# Patient Record
Sex: Male | Born: 1955
Health system: Southern US, Community
[De-identification: ages and names within clinical notes are randomized; demographics above are authoritative.]

## PROBLEM LIST (undated history)

## (undated) DIAGNOSIS — I219 Acute myocardial infarction, unspecified: Secondary | ICD-10-CM

## (undated) DIAGNOSIS — I2510 Atherosclerotic heart disease of native coronary artery without angina pectoris: Secondary | ICD-10-CM

## (undated) DIAGNOSIS — E7800 Pure hypercholesterolemia, unspecified: Secondary | ICD-10-CM

## (undated) DIAGNOSIS — I255 Ischemic cardiomyopathy: Secondary | ICD-10-CM

## (undated) HISTORY — DX: Ischemic cardiomyopathy: I25.5

---

## 2004-03-30 ENCOUNTER — Ambulatory Visit: Payer: Self-pay | Admitting: Internal Medicine

## 2004-04-06 ENCOUNTER — Ambulatory Visit: Payer: Self-pay | Admitting: Internal Medicine

## 2012-05-29 ENCOUNTER — Emergency Department (HOSPITAL_COMMUNITY)
Admission: EM | Admit: 2012-05-29 | Discharge: 2012-05-29 | Disposition: A | Discharge status: Home / Self Care | Payer: Self-pay | Attending: Emergency Medicine | Admitting: Emergency Medicine

## 2012-05-29 ENCOUNTER — Encounter (HOSPITAL_COMMUNITY): Payer: Self-pay | Admitting: *Deleted

## 2012-05-29 DIAGNOSIS — I252 Old myocardial infarction: Secondary | ICD-10-CM | POA: Insufficient documentation

## 2012-05-29 DIAGNOSIS — Z79899 Other long term (current) drug therapy: Secondary | ICD-10-CM | POA: Insufficient documentation

## 2012-05-29 DIAGNOSIS — R369 Urethral discharge, unspecified: Secondary | ICD-10-CM | POA: Insufficient documentation

## 2012-05-29 DIAGNOSIS — E7800 Pure hypercholesterolemia, unspecified: Secondary | ICD-10-CM | POA: Insufficient documentation

## 2012-05-29 DIAGNOSIS — Z7982 Long term (current) use of aspirin: Secondary | ICD-10-CM | POA: Insufficient documentation

## 2012-05-29 DIAGNOSIS — F17200 Nicotine dependence, unspecified, uncomplicated: Secondary | ICD-10-CM | POA: Insufficient documentation

## 2012-05-29 DIAGNOSIS — I2510 Atherosclerotic heart disease of native coronary artery without angina pectoris: Secondary | ICD-10-CM | POA: Insufficient documentation

## 2012-05-29 DIAGNOSIS — Z7902 Long term (current) use of antithrombotics/antiplatelets: Secondary | ICD-10-CM | POA: Insufficient documentation

## 2012-05-29 HISTORY — DX: Pure hypercholesterolemia, unspecified: E78.00

## 2012-05-29 HISTORY — DX: Acute myocardial infarction, unspecified: I21.9

## 2012-05-29 HISTORY — DX: Atherosclerotic heart disease of native coronary artery without angina pectoris: I25.10

## 2012-05-29 MED ORDER — AZITHROMYCIN 250 MG PO TABS
1000.0000 mg | ORAL_TABLET | Freq: Once | ORAL | Status: AC
  Administered 2012-05-29: 1000 mg via ORAL
  Filled 2012-05-29: qty 4

## 2012-05-29 MED ORDER — CEFTRIAXONE SODIUM 250 MG IJ SOLR
250.0000 mg | Freq: Once | INTRAMUSCULAR | Status: AC
  Administered 2012-05-29: 250 mg via INTRAMUSCULAR
  Filled 2012-05-29: qty 250

## 2012-05-29 NOTE — ED Notes (Signed)
Pt reports penile discharge since Tuesday, denies diff or pain urinating. No acute distress noted at triage.

## 2012-05-29 NOTE — ED Provider Notes (Signed)
Medical screening examination/treatment/procedure(s) were performed by non-physician practitioner and as supervising physician I was immediately available for consultation/collaboration.   Charles B. Bernette Mayers, MD 05/29/12 1440

## 2012-05-29 NOTE — ED Provider Notes (Signed)
History     CSN: [id number]  Arrival date & time 05/29/12  1317   First MD Initiated Contact with Patient 05/29/12 1411      Chief Complaint  Patient presents with  . Penile Discharge    (Consider location/radiation/quality/duration/timing/severity/associated sxs/prior treatment) HPI Patient to the ED for complaints of penile discharge since Tuesday. He describes it as milky white. He is sexually active without protection but says he has only been sleeping with the same girl. Denies any fevers, rashes, penile pain, dysuria, abdominal pain, N/V/D. nad vss Past Medical History  Diagnosis Date  . MI (myocardial infarction)   . Coronary artery disease   . High cholesterol     History reviewed. No pertinent past surgical history.  History reviewed. No pertinent family history.  History  Substance Use Topics  . Smoking status: Current Every Day Smoker    Types: Cigars  . Smokeless tobacco: Not on file  . Alcohol Use: Yes     Comment: occ      Review of Systems  Genitourinary: Positive for discharge.    Allergies  Review of patient's allergies indicates no known allergies.  Home Medications   Current Outpatient Rx  Name  Route  Sig  Dispense  Refill  . aspirin 81 MG tablet   Oral   Take 81 mg by mouth daily.         Marland Kitchen atenolol (TENORMIN) 50 MG tablet   Oral   Take 50 mg by mouth daily.         . clopidogrel (PLAVIX) 75 MG tablet   Oral   Take 75 mg by mouth daily.         . simvastatin (ZOCOR) 20 MG tablet   Oral   Take 20 mg by mouth every evening.           BP 119/73  Pulse 72  Temp(Src) 98.4 F (36.9 C) (Oral)  Resp 16  SpO2 99%  Physical Exam  Nursing note and vitals reviewed. Constitutional: He appears well-developed and well-nourished. No distress.  HENT:  Head: Normocephalic and atraumatic.  Eyes: Pupils are equal, round, and reactive to light.  Neck: Normal range of motion. Neck supple.  Cardiovascular: Normal rate and regular  rhythm.   Pulmonary/Chest: Effort normal.  Abdominal: Soft.  Genitourinary: Penis normal. No discharge found.  Neurological: He is alert.  Skin: Skin is warm and dry.    ED Course  Procedures (including critical care time)  Labs Reviewed  GC/CHLAMYDIA PROBE AMP   No results found.   1. Penile discharge       MDM  Given given rocephin and Azithro in ED.  Will be called if cultures are positive.  Pt has been advised of the symptoms that warrant their return to the ED. Patient has voiced understanding and has agreed to follow-up with the PCP or specialist.         Dorthula Matas, PA-C 05/29/12 1430

## 2014-05-02 ENCOUNTER — Emergency Department (HOSPITAL_COMMUNITY): Payer: Medicaid Other

## 2014-05-02 ENCOUNTER — Inpatient Hospital Stay (HOSPITAL_COMMUNITY)
Admission: EM | Admit: 2014-05-02 | Discharge: 2014-05-04 | DRG: 247 | Disposition: A | Discharge status: Home / Self Care | Payer: Medicaid Other | Attending: Cardiovascular Disease | Admitting: Cardiovascular Disease

## 2014-05-02 ENCOUNTER — Encounter (HOSPITAL_COMMUNITY): Payer: Self-pay | Admitting: Family Medicine

## 2014-05-02 DIAGNOSIS — R079 Chest pain, unspecified: Secondary | ICD-10-CM | POA: Diagnosis present

## 2014-05-02 DIAGNOSIS — Z7902 Long term (current) use of antithrombotics/antiplatelets: Secondary | ICD-10-CM

## 2014-05-02 DIAGNOSIS — Z79899 Other long term (current) drug therapy: Secondary | ICD-10-CM

## 2014-05-02 DIAGNOSIS — Z955 Presence of coronary angioplasty implant and graft: Secondary | ICD-10-CM

## 2014-05-02 DIAGNOSIS — I255 Ischemic cardiomyopathy: Secondary | ICD-10-CM | POA: Diagnosis present

## 2014-05-02 DIAGNOSIS — Z7982 Long term (current) use of aspirin: Secondary | ICD-10-CM | POA: Diagnosis not present

## 2014-05-02 DIAGNOSIS — I252 Old myocardial infarction: Secondary | ICD-10-CM | POA: Diagnosis not present

## 2014-05-02 DIAGNOSIS — Z720 Tobacco use: Secondary | ICD-10-CM | POA: Diagnosis present

## 2014-05-02 DIAGNOSIS — R001 Bradycardia, unspecified: Secondary | ICD-10-CM | POA: Diagnosis not present

## 2014-05-02 DIAGNOSIS — I10 Essential (primary) hypertension: Secondary | ICD-10-CM | POA: Diagnosis present

## 2014-05-02 DIAGNOSIS — E785 Hyperlipidemia, unspecified: Secondary | ICD-10-CM

## 2014-05-02 DIAGNOSIS — I214 Non-ST elevation (NSTEMI) myocardial infarction: Secondary | ICD-10-CM | POA: Diagnosis present

## 2014-05-02 DIAGNOSIS — I25110 Atherosclerotic heart disease of native coronary artery with unstable angina pectoris: Secondary | ICD-10-CM | POA: Diagnosis present

## 2014-05-02 DIAGNOSIS — Z9861 Coronary angioplasty status: Secondary | ICD-10-CM

## 2014-05-02 DIAGNOSIS — E780 Pure hypercholesterolemia: Secondary | ICD-10-CM | POA: Diagnosis present

## 2014-05-02 DIAGNOSIS — F17290 Nicotine dependence, other tobacco product, uncomplicated: Secondary | ICD-10-CM | POA: Diagnosis present

## 2014-05-02 DIAGNOSIS — I200 Unstable angina: Secondary | ICD-10-CM | POA: Diagnosis present

## 2014-05-02 DIAGNOSIS — I2510 Atherosclerotic heart disease of native coronary artery without angina pectoris: Secondary | ICD-10-CM | POA: Diagnosis present

## 2014-05-02 LAB — BASIC METABOLIC PANEL
ANION GAP: 10 (ref 5–15)
BUN: 9 mg/dL (ref 6–23)
CO2: 24 mmol/L (ref 19–32)
Calcium: 9.2 mg/dL (ref 8.4–10.5)
Chloride: 103 mmol/L (ref 96–112)
Creatinine, Ser: 0.95 mg/dL (ref 0.50–1.35)
GFR calc non Af Amer: 89 mL/min — ABNORMAL LOW (ref >90)
GLUCOSE: 77 mg/dL (ref 70–99)
POTASSIUM: 3.8 mmol/L (ref 3.5–5.1)
Sodium: 137 mmol/L (ref 135–145)

## 2014-05-02 LAB — BRAIN NATRIURETIC PEPTIDE: B NATRIURETIC PEPTIDE 5: 54.4 pg/mL (ref 0.0–100.0)

## 2014-05-02 LAB — CBC
HEMATOCRIT: 46.5 % (ref 39.0–52.0)
Hemoglobin: 15.9 g/dL (ref 13.0–17.0)
MCH: 29.2 pg (ref 26.0–34.0)
MCHC: 34.2 g/dL (ref 30.0–36.0)
MCV: 85.5 fL (ref 78.0–100.0)
Platelets: 269 10*3/uL (ref 150–400)
RBC: 5.44 MIL/uL (ref 4.22–5.81)
RDW: 14.8 % (ref 11.5–15.5)
WBC: 6.5 10*3/uL (ref 4.0–10.5)

## 2014-05-02 LAB — RAPID URINE DRUG SCREEN, HOSP PERFORMED
AMPHETAMINES: NOT DETECTED
BARBITURATES: NOT DETECTED
BENZODIAZEPINES: NOT DETECTED
COCAINE: NOT DETECTED
OPIATES: NOT DETECTED
Tetrahydrocannabinol: POSITIVE — AB

## 2014-05-02 LAB — MRSA PCR SCREENING: MRSA by PCR: NEGATIVE

## 2014-05-02 LAB — HEPARIN LEVEL (UNFRACTIONATED)
HEPARIN UNFRACTIONATED: 0.24 [IU]/mL — AB (ref 0.30–0.70)
Heparin Unfractionated: 0.33 IU/mL (ref 0.30–0.70)

## 2014-05-02 LAB — TROPONIN I: TROPONIN I: 1.16 ng/mL — AB (ref <0.031)

## 2014-05-02 LAB — I-STAT TROPONIN, ED: TROPONIN I, POC: 0.54 ng/mL — AB (ref 0.00–0.08)

## 2014-05-02 MED ORDER — ASPIRIN EC 81 MG PO TBEC
81.0000 mg | DELAYED_RELEASE_TABLET | Freq: Every day | ORAL | Status: DC
  Administered 2014-05-03 – 2014-05-04 (×2): 81 mg via ORAL
  Filled 2014-05-02 (×2): qty 1

## 2014-05-02 MED ORDER — HEPARIN BOLUS VIA INFUSION
4000.0000 [IU] | Freq: Once | INTRAVENOUS | Status: AC
  Administered 2014-05-02: 4000 [IU] via INTRAVENOUS
  Filled 2014-05-02: qty 4000

## 2014-05-02 MED ORDER — ATENOLOL 50 MG PO TABS
50.0000 mg | ORAL_TABLET | Freq: Every day | ORAL | Status: DC
  Administered 2014-05-02 – 2014-05-04 (×3): 50 mg via ORAL
  Filled 2014-05-02 (×3): qty 1

## 2014-05-02 MED ORDER — ASPIRIN 81 MG PO CHEW
81.0000 mg | CHEWABLE_TABLET | ORAL | Status: AC
  Administered 2014-05-03: 81 mg via ORAL
  Filled 2014-05-02: qty 1

## 2014-05-02 MED ORDER — NITROGLYCERIN 0.4 MG SL SUBL
0.4000 mg | SUBLINGUAL_TABLET | SUBLINGUAL | Status: DC | PRN
  Administered 2014-05-02: 0.4 mg via SUBLINGUAL
  Filled 2014-05-02: qty 1

## 2014-05-02 MED ORDER — HEPARIN (PORCINE) IN NACL 100-0.45 UNIT/ML-% IJ SOLN
1100.0000 [IU]/h | INTRAMUSCULAR | Status: DC
  Administered 2014-05-02: 950 [IU]/h via INTRAVENOUS
  Administered 2014-05-03: 1100 [IU]/h via INTRAVENOUS
  Filled 2014-05-02 (×3): qty 250

## 2014-05-02 MED ORDER — SODIUM CHLORIDE 0.9 % IV SOLN: 250.0000 mL | INTRAVENOUS | Status: DC | PRN

## 2014-05-02 MED ORDER — ASPIRIN 300 MG RE SUPP: 300.0000 mg | RECTAL | Status: AC

## 2014-05-02 MED ORDER — SODIUM CHLORIDE 0.9 % IJ SOLN
3.0000 mL | Freq: Two times a day (BID) | INTRAMUSCULAR | Status: DC
  Administered 2014-05-02 – 2014-05-03 (×2): 3 mL via INTRAVENOUS

## 2014-05-02 MED ORDER — ACETAMINOPHEN 325 MG PO TABS: 650.0000 mg | ORAL_TABLET | ORAL | Status: DC | PRN

## 2014-05-02 MED ORDER — ONDANSETRON HCL 4 MG/2ML IJ SOLN: 4.0000 mg | Freq: Four times a day (QID) | INTRAMUSCULAR | Status: DC | PRN

## 2014-05-02 MED ORDER — SIMVASTATIN 20 MG PO TABS
20.0000 mg | ORAL_TABLET | Freq: Every evening | ORAL | Status: DC
  Administered 2014-05-02: 20 mg via ORAL
  Filled 2014-05-02 (×2): qty 1

## 2014-05-02 MED ORDER — CLOPIDOGREL BISULFATE 75 MG PO TABS
75.0000 mg | ORAL_TABLET | Freq: Every day | ORAL | Status: DC
  Administered 2014-05-02 – 2014-05-03 (×2): 75 mg via ORAL
  Filled 2014-05-02 (×2): qty 1

## 2014-05-02 MED ORDER — SODIUM CHLORIDE 0.9 % IJ SOLN: 3.0000 mL | INTRAMUSCULAR | Status: DC | PRN

## 2014-05-02 MED ORDER — ASPIRIN 81 MG PO CHEW
324.0000 mg | CHEWABLE_TABLET | ORAL | Status: AC
  Administered 2014-05-02: 324 mg via ORAL
  Filled 2014-05-02: qty 4

## 2014-05-02 MED ORDER — NITROGLYCERIN IN D5W 200-5 MCG/ML-% IV SOLN
0.0000 ug/min | Freq: Once | INTRAVENOUS | Status: AC
  Administered 2014-05-02: 5 ug/min via INTRAVENOUS
  Filled 2014-05-02: qty 250

## 2014-05-02 MED ORDER — ASPIRIN 81 MG PO TABS: 81.0000 mg | ORAL_TABLET | Freq: Every day | ORAL | Status: DC

## 2014-05-02 MED ORDER — ASPIRIN 81 MG PO CHEW
324.0000 mg | CHEWABLE_TABLET | Freq: Once | ORAL | Status: AC
  Administered 2014-05-02: 324 mg via ORAL
  Filled 2014-05-02: qty 4

## 2014-05-02 MED ORDER — NITROGLYCERIN 0.4 MG SL SUBL
0.4000 mg | SUBLINGUAL_TABLET | SUBLINGUAL | Status: DC | PRN
  Administered 2014-05-04 (×2): 0.4 mg via SUBLINGUAL
  Filled 2014-05-02: qty 1

## 2014-05-02 MED ORDER — SODIUM CHLORIDE 0.9 % IV SOLN
INTRAVENOUS | Status: DC
  Administered 2014-05-03: 04:00:00 via INTRAVENOUS

## 2014-05-02 NOTE — ED Notes (Signed)
Dr. Elease HashimotoNahser at bedside, informed of troponin.

## 2014-05-02 NOTE — H&P (Signed)
ADMISSION HISTORY AND PHYSICAL   Date: 05/02/2014               Patient Name:  Kyle Pittman MRN: [id number]  DOB: November 25, 1955 Age / Sex: 59 y.o., male        PCP: No PCP Per Patient Primary Cardiologist: new         History of Present Illness: Patient is a 59 y.o. male with a PMHx of CAD, MI , who was admitted to [facility] on 05/02/2014 for evaluation of chest discomfort.  Pt had a MI while in [place] and was found to have a prox LAD stenosis ( was told he had a widow maker MI).  Had a stent placed.  He has not had any of his meds in over a month .   He goes to the [employer].  Has AHA insurance through [place] and his policy was stopped.  Did not have anyone to fill his scripst  CP started at 10 pm last night, while sitting around. Pain was very similar to previous mI Mid sternal, radiated through to the back. Associated with increase shortness of breath.  No diaphoresis, no syncope Lasted all night long.   Did not worsen with walking to work ( works in Network engineer, Warden/ranger)   still smokes Etoh on the weekends  Medications: Outpatient medications:  (Not in a hospital admission)  No Known Allergies   Past Medical History  Diagnosis Date  . MI (myocardial infarction)   . Coronary artery disease   . High cholesterol     History reviewed. No pertinent past surgical history.  History reviewed. No pertinent family history.  Social History:  reports that he has been smoking Cigars.  He does not have any smokeless tobacco history on file. He reports that he drinks alcohol. He reports that he does not use illicit drugs.   Review of Systems: Constitutional:  denies fever, chills, diaphoresis, appetite change and fatigue.  HEENT: denies photophobia, eye pain, redness, hearing loss, ear pain, congestion, sore throat, rhinorrhea, sneezing, neck pain, neck stiffness and tinnitus.  Respiratory: denies SOB, DOE, cough, chest tightness, and wheezing.    Cardiovascular: admits to chest pain,  Denies any palpitations and leg swelling.  Gastrointestinal: denies nausea, vomiting, abdominal pain, diarrhea, constipation, blood in stool.  Genitourinary: denies dysuria, urgency, frequency, hematuria, flank pain and difficulty urinating.  Musculoskeletal: denies  myalgias, back pain, joint swelling, arthralgias and gait problem.   Skin: denies pallor, rash and wound.  Neurological: denies dizziness, seizures, syncope, weakness, light-headedness, numbness and headaches.   Hematological: denies adenopathy, easy bruising, personal or family bleeding history.  Psychiatric/ Behavioral: denies suicidal ideation, mood changes, confusion, nervousness, sleep disturbance and agitation.    Physical Exam: BP 136/86 mmHg  Pulse 53  Temp(Src) 98.1 F (36.7 C)  Resp 17  Ht  (1.88 m)  Wt 173 lb (78.472 kg)  BMI 22.20 kg/m2  SpO2 98%  Wt Readings from Last 3 Encounters:  05/02/14 173 lb (78.472 kg)    General: Vital signs reviewed and noted. Well-developed, well-nourished, in no acute distress; alert,   Head: Normocephalic, atraumatic, sclera anicteric, mucus membranes are moist   Neck: Supple. Negative for carotid bruits. JVD not elevated.   Lungs:  Clear bilaterally to auscultation without wheezes, rales, or rhonchi. Breathing is normal   Heart: RRR with S1 S2. No murmurs, rubs, or gallops.   Abdomen:  Soft, non-tender, non-distended with normoactive bowel sounds. No hepatomegaly.  No rebound/guarding. No obvious abdominal masses   MSK: Strength and the appear normal for age.   Extremities: No clubbing or cyanosis. No edema.  Distal pedal pulses are 2+ and equal bilaterally .  Neurologic: Alert and oriented X 3. Moves all extremities spontaneously   Psych:  normal \    Lab results: Basic Metabolic Panel:  Recent Labs Lab 05/02/14 0842  NA 137  K 3.8  CL 103  CO2 24  GLUCOSE 77  BUN 9  CREATININE 0.95  CALCIUM 9.2    Liver Function  Tests: No results for input(s): AST, ALT, ALKPHOS, BILITOT, PROT, ALBUMIN in the last 168 hours. No results for input(s): LIPASE, AMYLASE in the last 168 hours.  CBC:  Recent Labs Lab 05/02/14 0842  WBC 6.5  HGB 15.9  HCT 46.5  MCV 85.5  PLT 269    Cardiac Enzymes: No results for input(s): CKTOTAL, CKMB, CKMBINDEX, TROPONINI in the last 168 hours.  BNP: Invalid input(s): POCBNP  CBG: No results for input(s): GLUCAP in the last 168 hours.  Coagulation Studies: No results for input(s): LABPROT, INR in the last 72 hours.   Other results:  EKG personally reviewed.  NSR old ant. MI, TWI in the anterior leads.   No old ecg to compare      Imaging:  No results found.    Assessment & Plan:  1. Non-ST segment elevation myocardial infarction: The patient has a history of a stent to the LAD from 2012. He has not been able to take his medications for the past month because his insurance was canceled when [employer] stopped being part of the Afordable Care Act.   He now presents with symptoms that are identical to his previous MI although they're not as bad. He started having episodes of pain approximately 12 hours ago. He did not have any nitroglycerin glycerin. He's now on a nitro drip and heparin drip and his pains are relieved. History of pontine levels are elevated.  His EKG shows old anterior Q waves and shows T wave inversions. I do not have an old EKG for comparison.  We will continue him on heparin and nitroglycerin for now. We have scheduled him for cardiac catheter station tomorrow. We discussed the risks, benefits, and options of Cardec cavitation. He understands and agrees to proceed. We'll check fasting lipids tomorrow.  2. Hyperlipidemia: We will will resume his simvastatin. He may do better with atorvastatin.  3. Hypertension: Stable   DVT PPX - IV heparin    Vesta MixerPhilip J. Nahser, Montez HagemanJr., MD, [facility] 05/02/2014, 9:40 AM

## 2014-05-02 NOTE — ED Notes (Signed)
Cardiology at bedside.

## 2014-05-02 NOTE — Progress Notes (Signed)
Patient from ED. Patient comfortable. VS stable. No complaints of CP at this time.  Rise PaganiniURRY, Zeeshan Korte R, RN

## 2014-05-02 NOTE — Care Management Note (Addendum)
    Page 1 of 1   05/03/2014     4:04:14 PM CARE MANAGEMENT NOTE 05/03/2014  Patient:  Kyle Pittman,Kyle Pittman   Account Number:  [id number]  Date Initiated:  05/02/2014  Documentation initiated by:  Junius CreamerWELL,DEBBIE  Subjective/Objective Assessment:   adm w nstemi     Action/Plan:   pt lives at home, was going [facility] but ins ran out   Anticipated DC Date:     Anticipated DC Plan:  HOME/SELF CARE      DC Planning Services  CM consult  [facility]      Choice offered to / List presented to:             Status of service:  In process, will continue to follow Medicare Important Message given?   (If response is "NO", the following Medicare IM given date fields will be blank) Date Medicare IM given:   Medicare IM given by:   Date Additional Medicare IM given:   Additional Medicare IM given by:    Discharge Disposition:    Per UR Regulation:  Reviewed for med. necessity/level of care/duration of stay  If discussed at Long Length of Stay Meetings, dates discussed:    Comments:  05/03/2014 @ 1545 Gae GallopAngela Cole RN,BSN, CM CM reinforced info given to pt  in regard [facility]. [facility]  made to establish PCP / medications on 05/06/2014 @ 3:00PM. [facility]il[place] pamphlet given along with 30 day free card to pt. CM informed pt  once  d/c  to [facility]. No other needs identified @ present.     4/25 1412 debbie dowell rn,bsn gave pt inform on [facility]. he was going [facility] but ins has run out and has not been able to get anymore w obamacare. he is interested in [facility].

## 2014-05-02 NOTE — Progress Notes (Signed)
ANTICOAGULATION CONSULT NOTE - Initial Consult  Pharmacy Consult for Heparin Indication: chest pain/ACS  No Known Allergies  Patient Measurements: Height: 6\' 2"  (188 cm) Weight: 173 lb (78.472 kg) IBW/kg (Calculated) : 82.2 Heparin Dosing Weight: 78 kg  Vital Signs: Temp: 98.1 F (36.7 C) (04/25 0829) BP: 136/86 mmHg (04/25 0915) Pulse Rate: 53 (04/25 0915)  Labs:  Recent Labs  05/02/14 0842  HGB 15.9  HCT 46.5  PLT 269  CREATININE 0.95    Estimated Creatinine Clearance: 93 mL/min (by C-G formula based on Cr of 0.95).   Medical History: Past Medical History  Diagnosis Date  . MI (myocardial infarction)   . Coronary artery disease   . High cholesterol     Medications:  See electronic med rec  Assessment: 59 y.o. male presents with CP. Noted pt has been out of his meds for the past month. Trop elevated to 0.54. To begin heparin for ACS.  Goal of Therapy:  Heparin level 0.3-0.7 units/ml Monitor platelets by anticoagulation protocol: Yes   Plan:  Heparin IV bolus 4000 units Heparin IV gtt at 950 units/hr Heparin level in 6 hours Heparih level and CBC daily  Christoper Fabianaron Monik Lins, PharmD, BCPS Clinical pharmacist, pager [phone] 05/02/2014,9:34 AM

## 2014-05-02 NOTE — ED Notes (Signed)
Ordered heart healthy meal tray for pt. 

## 2014-05-02 NOTE — ED Notes (Addendum)
Per pt sts chest pain that started at 10 pm last night while watching tv. sts constant mid sternal and radiating into back. sts some SOB. Hx of MI in 2010. St she hasn't had meds in over 1 month.

## 2014-05-02 NOTE — ED Notes (Addendum)
Lab called with critical Troponin-- 1.16 -- Dr. Manus Gunningancour notified

## 2014-05-02 NOTE — ED Provider Notes (Signed)
CSN: [id number]     Arrival date & time 05/02/14  0818 History   First MD Initiated Contact with Patient 05/02/14 [phone]     Chief Complaint  Patient presents with  . Chest Pain     (Consider location/radiation/quality/duration/timing/severity/associated sxs/prior Treatment) HPI Comments: patient reports central chest pain that has been constant since 10 PM last night that onset at rest. Pain radiates to his mid back and is associated with some shortness of breath. Denies radiation to neck or arm. Denies any nausea, vomiting, abdominal pain, fever or cough. States had [place] in [place] in 2012 and this feels similar. He has been out of his medicines for the past 1 month including atenolol, Plavix and aspirin. Denies any leg pain or leg swelling. He does not have a cardiologist here.   The history is provided by the patient.    Past Medical History  Diagnosis Date  . MI (myocardial infarction)   . Coronary artery disease   . High cholesterol    History reviewed. No pertinent past surgical history. History reviewed. No pertinent family history. History  Substance Use Topics  . Smoking status: Current Every Day Smoker    Types: Cigars  . Smokeless tobacco: Not on file  . Alcohol Use: Yes     Comment: occ    Review of Systems  Constitutional: Negative for fever, activity change and appetite change.  HENT: Negative for congestion and rhinorrhea.   Respiratory: Positive for chest tightness and shortness of breath.   Cardiovascular: Positive for chest pain.  Gastrointestinal: Negative for nausea and abdominal pain.  Genitourinary: Negative for dysuria and hematuria.  Musculoskeletal: Negative for myalgias and arthralgias.  Skin: Negative for rash.  Neurological: Negative for dizziness, weakness and headaches.  A complete 10 system review of systems was obtained and all systems are negative except as noted in the HPI and PMH.      Allergies  Review of patient's allergies  indicates no known allergies.  Home Medications   Prior to Admission medications   Medication Sig Start Date End Date Taking? Authorizing Provider  aspirin 81 MG tablet Take 81 mg by mouth daily.   Yes Historical Provider, MD  atenolol (TENORMIN) 50 MG tablet Take 50 mg by mouth daily.   Yes Historical Provider, MD  clopidogrel (PLAVIX) 75 MG tablet Take 75 mg by mouth daily.   Yes Historical Provider, MD  simvastatin (ZOCOR) 20 MG tablet Take 20 mg by mouth every evening.   Yes Historical Provider, MD   BP 113/76 mmHg  Pulse 56  Temp(Src) 98.1 F (36.7 C)  Resp 18  Ht 6\' 2"  (1.88 m)  Wt 173 lb (78.472 kg)  BMI 22.20 kg/m2  SpO2 100% Physical Exam  Constitutional: He is oriented to person, place, and time. He appears well-developed and well-nourished. No distress.  HENT:  Head: Normocephalic and atraumatic.  Mouth/Throat: Oropharynx is clear and moist. No oropharyngeal exudate.  Eyes: Conjunctivae and EOM are normal. Pupils are equal, round, and reactive to light.  Neck: Normal range of motion. Neck supple.  No meningismus.  Cardiovascular: Normal rate, regular rhythm, normal heart sounds and intact distal pulses.   No murmur heard. Pulmonary/Chest: Effort normal and breath sounds normal. No respiratory distress.  Abdominal: Soft. There is no tenderness. There is no rebound and no guarding.  Musculoskeletal: Normal range of motion. He exhibits no edema or tenderness.  Neurological: He is alert and oriented to person, place, and time. No cranial nerve deficit. He  exhibits normal muscle tone. Coordination normal.  No ataxia on finger to nose bilaterally. No pronator drift. 5/5 strength throughout. CN 2-12 intact. Negative Romberg. Equal grip strength. Sensation intact. Gait is normal.   Skin: Skin is warm.  Psychiatric: He has a normal mood and affect. His behavior is normal.  Nursing note and vitals reviewed.   ED Course  Procedures (including critical care time) Labs  Review Labs Reviewed  BASIC METABOLIC PANEL - Abnormal; Notable for the following:    GFR calc non Af Amer 89 (*)    All other components within normal limits  TROPONIN I - Abnormal; Notable for the following:    Troponin I 1.16 (*)    All other components within normal limits  URINE RAPID DRUG SCREEN (HOSP PERFORMED) - Abnormal; Notable for the following:    Tetrahydrocannabinol POSITIVE (*)    All other components within normal limits  HEPARIN LEVEL (UNFRACTIONATED) - Abnormal; Notable for the following:    Heparin Unfractionated 0.24 (*)    All other components within normal limits  I-STAT TROPOININ, ED - Abnormal; Notable for the following:    Troponin i, poc 0.54 (*)    All other components within normal limits  MRSA PCR SCREENING  CBC  BRAIN NATRIURETIC PEPTIDE  HEPARIN LEVEL (UNFRACTIONATED)  CBC  BASIC METABOLIC PANEL  LIPID PANEL    Imaging Review Dg Chest Portable 1 View  05/02/2014   CLINICAL DATA:  Chest pain and shortness of breath  EXAM: PORTABLE CHEST - 1 VIEW  COMPARISON:  None.  FINDINGS: Normal heart size and mediastinal contours. Coronary stent noted. No acute infiltrate or edema. No effusion or pneumothorax. No acute osseous findings.  IMPRESSION: 1.  No acute finding. 2. Coronary stent.   Electronically Signed   By: Marnee Spring M.D.   On: 05/02/2014 09:44     EKG Interpretation   Date/Time:  Monday May 02 2014 08:23:06 EDT Ventricular Rate:  72 PR Interval:  168 QRS Duration: 90 QT Interval:  358 QTC Calculation: 392 R Axis:   102 Text Interpretation:  Normal sinus rhythm with sinus arrhythmia Rightward  axis Anteroseptal infarct , age undetermined Abnormal ECG No previous ECGs  available Confirmed by Arnell Mausolf  MD, Ximenna Fonseca [phone]) on 05/02/2014 8:29:10  AM      MDM   Final diagnoses:  Chest pain   Chest pain concerning for angina similar to previous episodes.  EKG shows septal Q waves T-wave inversions, no comparison. Aspirin,  nitroglycerin, labs, chest x-ray.  Troponin 0.54. Start heparin and nitroglycerin drip. Discussed with cardiology.  Patient reports improvement in chest pain with therapy. Doubt PE or aortic dissection. D/w Dr. Elease Hashimoto.  CRITICAL CARE Performed by: Glynn Octave Total critical care time: 35 Critical care time was exclusive of separately billable procedures and treating other patients. Critical care was necessary to treat or prevent imminent or life-threatening deterioration. Critical care was time spent personally by me on the following activities: development of treatment plan with patient and/or surrogate as well as nursing, discussions with consultants, evaluation of patient's response to treatment, examination of patient, obtaining history from patient or surrogate, ordering and performing treatments and interventions, ordering and review of laboratory studies, ordering and review of radiographic studies, pulse oximetry and re-evaluation of patient's condition.   Glynn Octave, MD 05/02/14 [phone]

## 2014-05-03 ENCOUNTER — Encounter (HOSPITAL_COMMUNITY)
Admission: EM | Disposition: A | Discharge status: Home / Self Care | Payer: Self-pay | Source: Home / Self Care | Attending: Cardiovascular Disease

## 2014-05-03 ENCOUNTER — Encounter (HOSPITAL_COMMUNITY): Payer: Self-pay | Admitting: Cardiology

## 2014-05-03 DIAGNOSIS — Z9861 Coronary angioplasty status: Secondary | ICD-10-CM

## 2014-05-03 DIAGNOSIS — I25110 Atherosclerotic heart disease of native coronary artery with unstable angina pectoris: Secondary | ICD-10-CM

## 2014-05-03 DIAGNOSIS — Z720 Tobacco use: Secondary | ICD-10-CM | POA: Diagnosis present

## 2014-05-03 DIAGNOSIS — I2510 Atherosclerotic heart disease of native coronary artery without angina pectoris: Secondary | ICD-10-CM | POA: Diagnosis present

## 2014-05-03 DIAGNOSIS — I214 Non-ST elevation (NSTEMI) myocardial infarction: Secondary | ICD-10-CM | POA: Diagnosis present

## 2014-05-03 DIAGNOSIS — I10 Essential (primary) hypertension: Secondary | ICD-10-CM

## 2014-05-03 HISTORY — PX: LEFT HEART CATHETERIZATION WITH CORONARY ANGIOGRAM: SHX5451

## 2014-05-03 HISTORY — PX: PERCUTANEOUS CORONARY STENT INTERVENTION (PCI-S): SHX5485

## 2014-05-03 LAB — CBC
HEMATOCRIT: 45.2 % (ref 39.0–52.0)
HEMOGLOBIN: 15.2 g/dL (ref 13.0–17.0)
MCH: 29.1 pg (ref 26.0–34.0)
MCHC: 33.6 g/dL (ref 30.0–36.0)
MCV: 86.6 fL (ref 78.0–100.0)
Platelets: 261 10*3/uL (ref 150–400)
RBC: 5.22 MIL/uL (ref 4.22–5.81)
RDW: 14.9 % (ref 11.5–15.5)
WBC: 6.7 10*3/uL (ref 4.0–10.5)

## 2014-05-03 LAB — LIPID PANEL
CHOL/HDL RATIO: 3.4 ratio
Cholesterol: 149 mg/dL (ref 0–200)
HDL: 44 mg/dL (ref >39)
LDL CALC: 92 mg/dL (ref 0–99)
Triglycerides: 66 mg/dL (ref <150)
VLDL: 13 mg/dL (ref 0–40)

## 2014-05-03 LAB — BASIC METABOLIC PANEL
ANION GAP: 7 (ref 5–15)
BUN: 10 mg/dL (ref 6–23)
CALCIUM: 8.7 mg/dL (ref 8.4–10.5)
CO2: 26 mmol/L (ref 19–32)
Chloride: 105 mmol/L (ref 96–112)
Creatinine, Ser: 0.92 mg/dL (ref 0.50–1.35)
GFR calc Af Amer: >90 mL/min (ref >90)
Glucose, Bld: 109 mg/dL — ABNORMAL HIGH (ref 70–99)
Potassium: 4.3 mmol/L (ref 3.5–5.1)
SODIUM: 138 mmol/L (ref 135–145)

## 2014-05-03 LAB — POCT ACTIVATED CLOTTING TIME: Activated Clotting Time: 595 seconds

## 2014-05-03 LAB — HEPARIN LEVEL (UNFRACTIONATED): HEPARIN UNFRACTIONATED: 0.3 [IU]/mL (ref 0.30–0.70)

## 2014-05-03 LAB — PROTIME-INR
INR: 1.07 (ref 0.00–1.49)
PROTHROMBIN TIME: 14 s (ref 11.6–15.2)

## 2014-05-03 SURGERY — LEFT HEART CATHETERIZATION WITH CORONARY ANGIOGRAM

## 2014-05-03 MED ORDER — BIVALIRUDIN 250 MG IV SOLR
INTRAVENOUS | Status: AC
  Filled 2014-05-03: qty 250

## 2014-05-03 MED ORDER — MORPHINE SULFATE 2 MG/ML IJ SOLN: 2.0000 mg | INTRAMUSCULAR | Status: DC | PRN

## 2014-05-03 MED ORDER — MIDAZOLAM HCL 2 MG/2ML IJ SOLN
INTRAMUSCULAR | Status: AC
  Filled 2014-05-03: qty 2

## 2014-05-03 MED ORDER — SODIUM CHLORIDE 0.9 % IV SOLN
1.0000 mL/kg/h | INTRAVENOUS | Status: AC
  Administered 2014-05-03: 12:00:00 1 mL/kg/h via INTRAVENOUS

## 2014-05-03 MED ORDER — LIDOCAINE HCL (PF) 1 % IJ SOLN
INTRAMUSCULAR | Status: AC
Start: 2014-05-03 — End: 2014-05-03
  Filled 2014-05-03: qty 30

## 2014-05-03 MED ORDER — TICAGRELOR 90 MG PO TABS
ORAL_TABLET | ORAL | Status: AC
  Filled 2014-05-03: qty 2

## 2014-05-03 MED ORDER — HEPARIN SODIUM (PORCINE) 1000 UNIT/ML IJ SOLN
INTRAMUSCULAR | Status: AC
Start: 2014-05-03 — End: 2014-05-03
  Filled 2014-05-03: qty 1

## 2014-05-03 MED ORDER — SODIUM CHLORIDE 0.9 % IJ SOLN: 3.0000 mL | INTRAMUSCULAR | Status: DC | PRN

## 2014-05-03 MED ORDER — VERAPAMIL HCL 2.5 MG/ML IV SOLN
INTRAVENOUS | Status: AC
Start: 2014-05-03 — End: 2014-05-03
  Filled 2014-05-03: qty 2

## 2014-05-03 MED ORDER — SODIUM CHLORIDE 0.9 % IJ SOLN: 3.0000 mL | Freq: Two times a day (BID) | INTRAMUSCULAR | Status: DC

## 2014-05-03 MED ORDER — TICAGRELOR 90 MG PO TABS
90.0000 mg | ORAL_TABLET | Freq: Two times a day (BID) | ORAL | Status: DC
  Administered 2014-05-03 – 2014-05-04 (×2): 90 mg via ORAL
  Filled 2014-05-03 (×3): qty 1

## 2014-05-03 MED ORDER — FENTANYL CITRATE (PF) 100 MCG/2ML IJ SOLN
INTRAMUSCULAR | Status: AC
  Filled 2014-05-03: qty 2

## 2014-05-03 MED ORDER — HEPARIN (PORCINE) IN NACL 2-0.9 UNIT/ML-% IJ SOLN
INTRAMUSCULAR | Status: AC
  Filled 2014-05-03: qty 1500

## 2014-05-03 MED ORDER — SODIUM CHLORIDE 0.9 % IV SOLN: 250.0000 mL | INTRAVENOUS | Status: DC | PRN

## 2014-05-03 MED ORDER — NICOTINE 14 MG/24HR TD PT24
14.0000 mg | MEDICATED_PATCH | Freq: Every day | TRANSDERMAL | Status: DC
  Administered 2014-05-03 – 2014-05-04 (×2): 14 mg via TRANSDERMAL
  Filled 2014-05-03 (×2): qty 1

## 2014-05-03 MED ORDER — NITROGLYCERIN 1 MG/10 ML FOR IR/CATH LAB
INTRA_ARTERIAL | Status: AC
  Filled 2014-05-03: qty 10

## 2014-05-03 NOTE — H&P (View-Only) (Signed)
Patient Name: Kyle Pittman Date of Encounter: 05/03/2014     Active Problems:   Unstable angina    SUBJECTIVE  The patient has had no further severe chest discomfort overnight.  He is awaiting cardiac catheterization today.  His EKG this morning shows sinus bradycardia and widespread anterolateral ST-T wave changes suggesting ischemia which are increased since admission.  Troponin was elevated on admission at 1.16.  CURRENT MEDS . aspirin EC  81 mg Oral Daily  . atenolol  50 mg Oral Daily  . clopidogrel  75 mg Oral Daily  . simvastatin  20 mg Oral QPM  . sodium chloride  3 mL Intravenous Q12H    OBJECTIVE  Filed Vitals:   05/03/14 0700 05/03/14 0720 05/03/14 0800 05/03/14 0916  BP: 112/77 112/77 121/74 125/83  Pulse: 54 56 55 57  Temp:  97.3 F (36.3 C)    TempSrc:  Oral    Resp: Height:      Weight:      SpO2: 96% 98% 100%     Intake/Output Summary (Last 24 hours) at 05/03/14 0922 Last data filed at 05/03/14 0800  Gross per 24 hour  Intake 1231.61 ml  Output   2025 ml  Net -793.39 ml   Filed Weights   05/02/14 0829 05/02/14 1500  Weight: 173 lb (78.472 kg) 166 lb 7.2 oz (75.5 kg)    PHYSICAL EXAM  General: Pleasant, NAD. Neuro: Alert and oriented X 3. Moves all extremities spontaneously. Psych: Normal affect. HEENT:  Normal  Neck: Supple without bruits or JVD. Lungs:  Resp regular and unlabored, CTA. Heart: RRR no s3, s4, or murmurs. Abdomen: Soft, non-tender, non-distended, BS + x 4.  Extremities: No clubbing, cyanosis or edema. DP/PT/Radials 2+ and equal bilaterally.  Accessory Clinical Findings  CBC  Recent Labs  05/02/14 0842 05/03/14 0348  WBC 6.5 6.7  HGB 15.9 15.2  HCT 46.5 45.2  MCV 85.5 86.6  PLT 269 261   Basic Metabolic Panel  Recent Labs  05/02/14 0842 05/03/14 0348  NA 137 138  K 3.8 4.3  CL 103 105  CO2 24 26  GLUCOSE 77 109*  BUN 9 10  CREATININE 0.95 0.92  CALCIUM 9.2 8.7   Liver Function Tests No  results for input(s): AST, ALT, ALKPHOS, BILITOT, PROT, ALBUMIN in the last 72 hours. No results for input(s): LIPASE, AMYLASE in the last 72 hours. Cardiac Enzymes  Recent Labs  05/02/14 0842  TROPONINI 1.16*   BNP Invalid input(s): POCBNP D-Dimer No results for input(s): DDIMER in the last 72 hours. Hemoglobin A1C No results for input(s): HGBA1C in the last 72 hours. Fasting Lipid Panel  Recent Labs  05/03/14 0348  CHOL 149  HDL 44  LDLCALC 92  TRIG 66  CHOLHDL 3.4   Thyroid Function Tests No results for input(s): TSH, T4TOTAL, T3FREE, THYROIDAB in the last 72 hours.  Invalid input(s): FREET3  TELE  Normal sinus rhythm  ECG  Sinus bradycardia.  Widespread anterior T wave changes increased since admission  Radiology/Studies  Dg Chest Portable 1 View  05/02/2014   CLINICAL DATA:  Chest pain and shortness of breath  EXAM: PORTABLE CHEST - 1 VIEW  COMPARISON:  None.  FINDINGS: Normal heart size and mediastinal contours. Coronary stent noted. No acute infiltrate or edema. No effusion or pneumothorax. No acute osseous findings.  IMPRESSION: 1.  No acute finding. 2. Coronary stent.   Electronically Signed   By: Kathrynn Ducking.D.  On: 05/02/2014 09:44    ASSESSMENT AND PLAN 1.  Non-STEMI.  History of a stent to the LAD in 2012.  No recent medications because of insurance lapse.  On IV heparin and IV nitroglycerin awaiting catheterization today. 2.  Hyperlipidemia 3.  Essential hypertension  Plan: Cardiac catheterization today. Signed, Cassell Clementhomas Grissel Tyrell  MD

## 2014-05-03 NOTE — Progress Notes (Signed)
ANTICOAGULATION CONSULT NOTE - Follow Up Consult  Pharmacy Consult for heparin Indication: NSTEMI   Labs:  Recent Labs  05/02/14 0842 05/02/14 1455 05/02/14 2243 05/03/14 0348  HGB 15.9  --   --  15.2  HCT 46.5  --   --  45.2  PLT 269  --   --  261  LABPROT  --   --   --  14.0  INR  --   --   --  1.07  HEPARINUNFRC  --  0.24* 0.33 0.30  CREATININE 0.95  --   --  0.92  TROPONINI 1.16*  --   --   --    . sodium chloride 100 mL/hr at 05/03/14 0800  . heparin 1,100 Units/hr (05/03/14 0800)     Assessment:  58yo male admitted with chest discomfort, hx CAD/MI, stent.  Out of meds x 1 mo PTA due to loss of insurance. Heparin level therapeutic on 1100 units/hr.  Going to cath lab now.    Plan: Heparin currently off. Plan to f/u plans for heparin after cath lab.  Tad MooreJessica Holland Kotter, Pharm D, BCPS  Clinical Pharmacist Pager [phone]  05/03/2014 9:25 AM

## 2014-05-03 NOTE — Progress Notes (Signed)
ANTICOAGULATION CONSULT NOTE - Follow Up Consult  Pharmacy Consult for heparin Indication: NSTEMI   Labs:  Recent Labs  05/02/14 0842 05/02/14 1455 05/02/14 2243  HGB 15.9  --   --   HCT 46.5  --   --   PLT 269  --   --   HEPARINUNFRC  --  0.24* 0.33  CREATININE 0.95  --   --   TROPONINI 1.16*  --   --      Assessment/Plan:  59yo male therapeutic on heparin after rate increase. Will continue gtt at current rate and confirm stable with am labs.   Vernard GamblesVeronda Clytie Shetley, PharmD, BCPS  05/03/2014,12:33 AM

## 2014-05-03 NOTE — Progress Notes (Signed)
TR BAND REMOVAL  LOCATION:  right radial  DEFLATED PER PROTOCOL:  Yes.    TIME BAND OFF / DRESSING APPLIED:   1600   SITE UPON ARRIVAL:   Level 0  SITE AFTER BAND REMOVAL:  Level 0  REVERSE ALLEN'S TEST:    positive  CIRCULATION SENSATION AND MOVEMENT:  Within Normal Limits  Yes.    COMMENTS:    

## 2014-05-03 NOTE — CV Procedure (Signed)
CARDIAC CATHETERIZATION AND PERCUTANEOUS CORONARY INTERVENTION REPORT  NAME:  Kyle Pittman   MRN: [id number] DOB:  1955/12/20   ADMIT DATE: 05/02/2014 Procedure Date: 05/03/2014  INTERVENTIONAL CARDIOLOGIST: Leonie Man, M.D., MS PRIMARY CARE PROVIDER: No PCP Per Patient PRIMARY CARDIOLOGIST: Dr. Acie Fredrickson (new to [facility])  PATIENT:  Kyle Pittman is a 59 y.o. male with a history of anterior STEMI in 2012 with an LAD lesion treated with a DES stent (Xience 3.5 mm x 18 mm). This was done in [place]. At the time, it is EF was suggested to be 50%. He had been on Plavix for close to 3 years but recently stopped after his insurance plan was altered. He also has a history of long-standing smoking as well as polysubstance abuse. He presented to [facility] on April 25 with signs and symptoms concerning for unstable angina/non-STEMI. He ruled in for mild non-ST elevation MI and is now referred for invasive evaluation.  PRE-OPERATIVE DIAGNOSIS:    Non-STEMI  Known CAD status post PCI to LAD (Xience 3.5 mm x 18 mm)  PROCEDURES PERFORMED:    Left Heart Catheterization with Native Coronary Angiography  via Right Radial Artery   Left Ventriculography  Percutaneous Coronary Intervention of proximal LAD (jailing D1) with a Xience alpine DES 3.5 mm x 12 mm (overlapping the previously placed stent)  PROCEDURE: The patient was brought to the 2nd [place] in the fasting state and prepped and draped in the usual sterile fashion for Right Radial artery access. A modified Allen's test was performed on the right wrist demonstrating excellent collateral flow for radial access.   Sterile technique was used including antiseptics, cap, gloves, gown, hand hygiene, mask and sheet. Skin prep: Chlorhexidine.   Consent: Risks of procedure as well as the alternatives and risks of each were explained to the (patient/caregiver). Consent for procedure obtained.    Time Out: Verified patient identification, verified procedure, site/side was marked, verified correct patient position, special equipment/implants available, medications/allergies/relevent history reviewed, required imaging and test results available. Performed.  Access:   Right Radial Artery: 6 Fr Sheath -  Seldinger Technique (Angiocath Micropuncture Kit)  Radial Cocktail - 10 mL; IV Heparin 4000 Units   Left Heart Catheterization: 5Fr Catheters advanced or exchanged over a long exchange safety J-wire under fluoroscopic guidance; TIG 4.0 catheter advanced first.  Left & Right Coronary Artery Cineangiography: TIG 4.0 Catheter   LV Hemodynamics (LV Gram): Angled pigtail  Sheath removed in the cardiac catheterization lab with TR band placement for hemostasis.  TR Band: 1050  Hours; 14 mL air  FINDINGS:  Hemodynamics:   Central Aortic Pressure / Mean: 90/60/70 mmHg  Left Ventricular Pressure / LVEDP: 89/7/18 mmHg  Left Ventriculography:  EF: ~30 %  Wall Motion: grossly hypokinetic with diffuse mid to apical anterior and inferoapical Akinesis.  Coronary Anatomy:  Dominance: Right  Left Main: Large caliber, bifurcates into LAD & Circumflex.  Angiographically normal LAD: Normal caliber vessel with a very proximal first diagonal branch. Just beyond the diagonal branch there is a stent that itself is widely patent. However in the very proximal edge of the stent there is a slight step up and then a area of contrast staining that suggests possible proximal edge tear of the stent. There is TIMI 1 flow distally down the LAD that gives off 2 more diagonal branches that are smaller in caliber than the first diagonal branch. The LAD reaches down around the apex.  D1: Moderate to  large caliber very proximal vessel that bifurcates in the mid vessel. Angiographic normal. Not involved with the lesion.  D2&3: Small moderate caliber vessels from the mid and distal LAD that are free of  disease. Left Circumflex: Large-caliber, nondominant vessel it gives rise a very proximal OM1 and then the mid vessel OM 2. Then terminates distally as a posterolateral branch from the AV groove. Angiographically normal vessels.  OM1: Large-caliber vessel. Courses is a high ramus intermedius. After Clearence Cheek in the mid vessel. Angiographically normal.  OM 2: Moderate caliber vessel from the mid circumflex. And grossly normal.   RCA: Are just caliber, dominant vessel that terminates as the Right Posterior Descending Artery (PDA), gives rise to small Right Posterior AV Groove Branch (RPAV) that only has 2 small posterolateral branches and the AV nodal artery..  The PDA remains at least moderate large-caliber distally on most all the way to the apex.  After reviewing the initial angiography, the culprit lesion was thought to be the pre-stent lesion with concerning contrast "hand-up that suggests proximal stent edge dissection.  Preparation were made to proceed with PCI on this lesion.  Given the location, teh proximal Diag 1 will need to be jailed.,   Percutaneous Coronary Intervention:  Lesion ~hazy 70% focal early mid LAD with TIMI 1 flow --> 0% with TIMI 3 flow. Guide: 6 Fr   XB LAD 3.5 Guidewire: BMW  Stent: Xience Alpine DES 3.5 mm x 12 mm; overlapping ~4 mm into the proximal edge of previous stent (Xience 3.5 mm x 18 mm)  16 Atm x 30 Sec - final diameter 3.8 mm Post-dilation Balloon: [place] 4.0 mm x 8 mm; At stent overlap  12 Atm x 45 Sec; Final Diameter: 4.0 mm  Post deployment angiography in multiple views, with and without guidewire in place revealed excellent stent deployment and lesion coverage.  There was no evidence of dissection or perforation.  MEDICATIONS:  Anesthesia:  Local Lidocaine 2 ml  Sedation:  2 mg IV Versed, 75 mcg IV fentanyl ;   Omnipaque Contrast: 200 ml  Anticoagulation:   IV Heparin 4000 Units ;   Angiomax Bolus & drip - complete bag  Anti-Platelet  Agent:  Brilinta 180 mg Radial Cocktail: 5 mg Verapamil, 400 mcg NTG, 2 ml 2% Lidocaine in 10 ml NS IC NTG 200 mcg x 2  PATIENT DISPOSITION:    The patient was transferred to the PACU holding area in a hemodynamicaly stable, chest pain free condition.  The patient tolerated the procedure well, and there were no complications.  EBL:   < 15 ml  The patient was stable before, during, and after the procedure.  POST-OPERATIVE DIAGNOSIS:    Single Vessel CAD with patent mid LAD stent that has a focal proximal stent edge dissection with TIMI 1 flow - treated successfully with proximal coverage using a Xience Alpine DES 3.5 mm x 12 mm  Severe Ischemic Cardiomyopathy with extensive LAD distribution hypo-to-Akinesis & EF ~30%.  Moderately elevated LVEDP  Otherwise minimal CAD in the remaining distribution.  PLAN OF CARE:  Standard Radial Cath Care with TR Band removal  DAPT x minimal of 1 yr  (unless Brilinta continued alone after 3 months)- would continue at least Plavix indefinitely  Would consider titration of Cardiomyopathy Rx   Consider discharge in AM, but would prefer to have medication regimen for Cardiomyopathy titrated.    Leonie Man, M.D., M.S. Interventional Cardiologist   Pager # [phone]

## 2014-05-03 NOTE — Research (Signed)
TWILIGHT Informed Consent   Subject Name: Kyle Pittman  Subject met inclusion and exclusion criteria.  The informed consent form, study requirements and expectations were reviewed with the subject and questions and concerns were addressed prior to the signing of the consent form.  The subject verbalized understanding of the trail requirements.  The subject agreed to participate in the TWILIGHT trial and signed the informed consent.  The informed consent was obtained prior to performance of any protocol-specific procedures for the subject.  A copy of the signed informed consent was given to the subject and a copy was placed in the subject's medical record.  Gawain Crombie 05/03/2014, 16:05PM

## 2014-05-03 NOTE — Interval H&P Note (Signed)
History and Physical Interval Note:  05/03/2014 9:34 AM  Kyle Pittman  has presented today for surgery, with the diagnosis of NSTEMI. The various methods of treatment have been discussed with the patient and family. After consideration of risks, benefits and other options for treatment, the patient has consented to  Procedure(s): LEFT HEART CATHETERIZATION WITH CORONARY ANGIOGRAM (N/A) +/- LAD as a surgical intervention .  The patient's history has been reviewed, patient examined, no change in status, stable for surgery.  I have reviewed the patient's chart and labs.  Questions were answered to the patient's satisfaction.    Cath Lab Visit (complete for each Cath Lab visit)  Clinical Evaluation Leading to the Procedure:   ACS: Yes.    Non-ACS:    Anginal Classification: CCS IV  Anti-ischemic medical therapy: Minimal Therapy (1 class of medications)  Non-Invasive Test Results: No non-invasive testing performed  Prior CABG: No previous CABG  TIMI Score  Patient Information:  TIMI Score is 5  Revascularization of the presumed culprit artery  A (9)  Indication: 11; Score: 9 TIMI Score  Patient Information:  TIMI Score is 5  Revascularization of multiple coronary arteries when the culprit artery cannot clearly be determined  A (9)  Indication: 12; Score: 9    HARDING, DAVID W

## 2014-05-03 NOTE — Progress Notes (Signed)
Told by cath lab staff pt was going to be going to [facility] post procedure. Report called to Rosanne AshingJim, RN. Belongings sent up to [facility]. Will cont to monitor closely

## 2014-05-03 NOTE — Progress Notes (Signed)
EKG CRITICAL VALUE     12 lead EKG performed.  Critical value noted. Leda GauzeMariah Norment, RN notified.   Deitra MayoWATLINGTON, Jeslin Bazinet H, CCT 05/03/2014 6:56 AM

## 2014-05-03 NOTE — Progress Notes (Signed)
 Patient Name: Kyle Pittman Date of Encounter: 05/03/2014     Active Problems:   Unstable angina    SUBJECTIVE  The patient has had no further severe chest discomfort overnight.  He is awaiting cardiac catheterization today.  His EKG this morning shows sinus bradycardia and widespread anterolateral ST-T wave changes suggesting ischemia which are increased since admission.  Troponin was elevated on admission at 1.16.  CURRENT MEDS . aspirin EC  81 mg Oral Daily  . atenolol  50 mg Oral Daily  . clopidogrel  75 mg Oral Daily  . simvastatin  20 mg Oral QPM  . sodium chloride  3 mL Intravenous Q12H    OBJECTIVE  Filed Vitals:   05/03/14 0700 05/03/14 0720 05/03/14 0800 05/03/14 0916  BP: 112/77 112/77 121/74 125/83  Pulse: 54 56 55 57  Temp:  97.3 F (36.3 C)    TempSrc:  Oral    Resp: 15 16 13   Height:      Weight:      SpO2: 96% 98% 100%     Intake/Output Summary (Last 24 hours) at 05/03/14 0922 Last data filed at 05/03/14 0800  Gross per 24 hour  Intake 1231.61 ml  Output   2025 ml  Net -793.39 ml   Filed Weights   05/02/14 0829 05/02/14 1500  Weight: 173 lb (78.472 kg) 166 lb 7.2 oz (75.5 kg)    PHYSICAL EXAM  General: Pleasant, NAD. Neuro: Alert and oriented X 3. Moves all extremities spontaneously. Psych: Normal affect. HEENT:  Normal  Neck: Supple without bruits or JVD. Lungs:  Resp regular and unlabored, CTA. Heart: RRR no s3, s4, or murmurs. Abdomen: Soft, non-tender, non-distended, BS + x 4.  Extremities: No clubbing, cyanosis or edema. DP/PT/Radials 2+ and equal bilaterally.  Accessory Clinical Findings  CBC  Recent Labs  05/02/14 0842 05/03/14 0348  WBC 6.5 6.7  HGB 15.9 15.2  HCT 46.5 45.2  MCV 85.5 86.6  PLT 269 261   Basic Metabolic Panel  Recent Labs  05/02/14 0842 05/03/14 0348  NA 137 138  K 3.8 4.3  CL 103 105  CO2 24 26  GLUCOSE 77 109*  BUN 9 10  CREATININE 0.95 0.92  CALCIUM 9.2 8.7   Liver Function Tests No  results for input(s): AST, ALT, ALKPHOS, BILITOT, PROT, ALBUMIN in the last 72 hours. No results for input(s): LIPASE, AMYLASE in the last 72 hours. Cardiac Enzymes  Recent Labs  05/02/14 0842  TROPONINI 1.16*   BNP Invalid input(s): POCBNP D-Dimer No results for input(s): DDIMER in the last 72 hours. Hemoglobin A1C No results for input(s): HGBA1C in the last 72 hours. Fasting Lipid Panel  Recent Labs  05/03/14 0348  CHOL 149  HDL 44  LDLCALC 92  TRIG 66  CHOLHDL 3.4   Thyroid Function Tests No results for input(s): TSH, T4TOTAL, T3FREE, THYROIDAB in the last 72 hours.  Invalid input(s): FREET3  TELE  Normal sinus rhythm  ECG  Sinus bradycardia.  Widespread anterior T wave changes increased since admission  Radiology/Studies  Dg Chest Portable 1 View  05/02/2014   CLINICAL DATA:  Chest pain and shortness of breath  EXAM: PORTABLE CHEST - 1 VIEW  COMPARISON:  None.  FINDINGS: Normal heart size and mediastinal contours. Coronary stent noted. No acute infiltrate or edema. No effusion or pneumothorax. No acute osseous findings.  IMPRESSION: 1.  No acute finding. 2. Coronary stent.   Electronically Signed   By: Jonathon  Watts M.D.     On: 05/02/2014 09:44    ASSESSMENT AND PLAN 1.  Non-STEMI.  History of a stent to the LAD in 2012.  No recent medications because of insurance lapse.  On IV heparin and IV nitroglycerin awaiting catheterization today. 2.  Hyperlipidemia 3.  Essential hypertension  Plan: Cardiac catheterization today. Signed, Kassady Laboy  MD   

## 2014-05-04 DIAGNOSIS — I2510 Atherosclerotic heart disease of native coronary artery without angina pectoris: Secondary | ICD-10-CM

## 2014-05-04 DIAGNOSIS — Z720 Tobacco use: Secondary | ICD-10-CM

## 2014-05-04 DIAGNOSIS — I213 ST elevation (STEMI) myocardial infarction of unspecified site: Secondary | ICD-10-CM

## 2014-05-04 DIAGNOSIS — Z9861 Coronary angioplasty status: Secondary | ICD-10-CM

## 2014-05-04 LAB — BASIC METABOLIC PANEL
Anion gap: 5 (ref 5–15)
BUN: 9 mg/dL (ref 6–23)
CHLORIDE: 105 mmol/L (ref 96–112)
CO2: 25 mmol/L (ref 19–32)
CREATININE: 0.89 mg/dL (ref 0.50–1.35)
Calcium: 8.8 mg/dL (ref 8.4–10.5)
GFR calc Af Amer: >90 mL/min (ref >90)
GFR calc non Af Amer: >90 mL/min (ref >90)
Glucose, Bld: 108 mg/dL — ABNORMAL HIGH (ref 70–99)
Potassium: 4.7 mmol/L (ref 3.5–5.1)
Sodium: 135 mmol/L (ref 135–145)

## 2014-05-04 MED ORDER — NICOTINE 14 MG/24HR TD PT24: 14.0000 mg | MEDICATED_PATCH | Freq: Every day | TRANSDERMAL | Status: DC

## 2014-05-04 MED ORDER — ATENOLOL 50 MG PO TABS: 50.0000 mg | ORAL_TABLET | Freq: Every day | ORAL | Status: DC

## 2014-05-04 MED ORDER — SIMVASTATIN 20 MG PO TABS: 20.0000 mg | ORAL_TABLET | Freq: Every evening | ORAL | Status: DC

## 2014-05-04 MED ORDER — TICAGRELOR 90 MG PO TABS: 90.0000 mg | ORAL_TABLET | Freq: Two times a day (BID) | ORAL | Status: DC

## 2014-05-04 MED ORDER — NITROGLYCERIN 0.4 MG SL SUBL: 0.4000 mg | SUBLINGUAL_TABLET | SUBLINGUAL | Status: DC | PRN

## 2014-05-04 MED FILL — Sodium Chloride IV Soln 0.9%: INTRAVENOUS | Qty: 50 | Status: AC

## 2014-05-04 NOTE — Progress Notes (Signed)
Pt c/o chest pain, 5/10 on pain scale, pt given 2 SL nitro, chest pain decreased to 1/10, Vital signs stable as charted, EKG done, Dr. Terressa KoyanagiBalfour aware, will continue to monitor patient.

## 2014-05-04 NOTE — Research (Deleted)
TWILIGHT Informed Consent   Subject Name: Kyle Pittman  Subject met inclusion and exclusion criteria.  The informed consent form, study requirements and expectations were reviewed with the subject and questions and concerns were addressed prior to the signing of the consent form.  The subject verbalized understanding of the trail requirements.  The subject agreed to participate in the TWILIGHT trial and signed the informed consent.  The informed consent was obtained prior to performance of any protocol-specific procedures for the subject.  A copy of the signed informed consent was given to the subject and a copy was placed in the subject's medical record.  Hedrick,Tammy W 05/03/2014, 1700  

## 2014-05-04 NOTE — Discharge Summary (Signed)
Physician Discharge Summary     Cardiologist:  Nahser(new) Patient ID:   Kyle Pittman MRN: [id number] DOB/AGE: 60-Dec-1957 58 y.o.  Admit date: 05/02/2014 Discharge date: 05/13/2014  Admission Diagnoses:  NSTEMI, Unstable angina  Discharge Diagnoses:  Active Problems:   Unstable angina   Non-STEMI (non-ST elevated myocardial infarction)   CAD S/P percutaneous coronary angioplasty   Tobacco abuse   Discharged Condition: stable  Hospital Course:   Patient is a 59 y.o. male with a PMHx of CAD, MI , who was admitted to [facility] on 05/02/2014 for evaluation of chest discomfort.  Pt had a MI while in [place] and was found to have a prox LAD stenosis (was told he had a widow maker MI). Had a stent placed. He has not had any of his meds in over a month.  He goes to the [employer]. Has AHA insurance through [place] and his policy was stopped. Did not have anyone to fill his scripts. CP started at 10 pm last night, while sitting around. Pain was very similar to previous MI; mid sternal, radiated through to the back. Associated with increase shortness of breath. No diaphoresis, no syncope.  Lasted all night long. Did not worsen with walking to work ( works in Geophysical data processor, Control and instrumentation engineer).  Still smokes.  [place]h on the weekends  He was admitted and placed on IV heparin and nitroglycerin.  Troponin was 1.16.  He was taken to the cath lab and coronary angiography revealed single vessel CAD with patent mid LAD stent that has a focal proximal stent edge dissection with TIMI 1 flow.  This was treated successfully with proximal coverage using a Xience Alpine DES 3.5 mm x 12 mm.  Severe Ischemic Cardiomyopathy with extensive LAD distribution hypo-to-Akinesis & EF ~30%. Moderately elevated LVEDP.  Otherwise minimal CAD in the remaining distribution.  The patient was started on ASA, brilinta and atenolol.  He ambulated the morning after his procedure with cardiac rehab 819f without chest pain.  He was  counseled on CHF, low sodium diet and importance of brilinta.  Smoking cessation counseling was also provided.  2D echo revealed EF of 45-50%, G1DD, Limited visualization however, the anteroseptal wall appears hypokinetic. The patient was seen by Dr. SMarlou Porchwho felt he was stable for DC home.     Consults: Cardiac Rehab  Significant Diagnostic Studies:  Study Conclusions  - Left ventricle: Systolic function was mildly reduced. The estimated ejection fraction was in the range of 45% to 50%. Limited visualization however the anteroseptal wall appears hypokinetic. Doppler parameters are consistent with abnormal left ventricular relaxation (grade 1 diastolic dysfunction). - Aortic valve: Mildly calcified annulus. Trileaflet; mildly thickened leaflets. Valve area (VTI): 2.51 cm^2. Valve area (Vmax): 3.05 cm^2. - Mitral valve: Mildly calcified annulus. Mildly thickened leaflets . There was mild regurgitation. - Left atrium: The atrium was mildly to moderately dilated. - Right atrium: The atrium was mildly dilated. - Technically adequate study.  CARDIAC CATHETERIZATION AND PERCUTANEOUS CORONARY INTERVENTION REPORT  NAME: Kyle MarsigliaDOB: 104/28/1957DMIT DATE: 05/02/2014 Procedure Date: 05/03/2014  INTERVENTIONAL CARDIOLOGIST: DLeonie Man M.D., MS PRIMARY CARE PROVIDER: No PCP Per Patient PRIMARY CARDIOLOGIST: Dr. NAcie Fredrickson(new to [facility])  PATIENT: Kyle Schinkeis a 59y.o. male with a history of anterior STEMI in 2012 with an LAD lesion treated with a DES stent (Xience 3.5 mm x 18 mm). [place] it is EF was suggested to be 50%. He had been on Plavix for  close to 3 years but recently stopped after his insurance plan was altered. He also has a history of long-standing smoking as well as polysubstance abuse. He presented to [facility] on April 25 with  signs and symptoms concerning for unstable angina/non-STEMI. He ruled in for mild non-ST elevation MI and is now referred for invasive evaluation.  PRE-OPERATIVE DIAGNOSIS:   Non-STEMI  Known CAD status post PCI to LAD (Xience 3.5 mm x 18 mm)  PROCEDURES PERFORMED:   Left Heart Catheterization with Native Coronary Angiography via Right Radial Artery   Left Ventriculography  Percutaneous Coronary Intervention of proximal LAD (jailing D1) with a Xience alpine DES 3.5 mm x 12 mm (overlapping the previously placed stent)  PROCEDURE: The patient was brought to the 2nd [facility] in the fasting state and prepped and draped in the usual sterile fashion for Right Radial artery access. A modified Allen's test was performed on the right wrist demonstrating excellent collateral flow for radial access. Sterile technique was used including antiseptics, cap, gloves, gown, hand hygiene, mask and sheet. Skin prep: Chlorhexidine.   Consent: Risks of procedure as well as the alternatives and risks of each were explained to the (patient/caregiver). Consent for procedure obtained.   Time Out: Verified patient identification, verified procedure, site/side was marked, verified correct patient position, special equipment/implants available, medications/allergies/relevent history reviewed, required imaging and test results available. Performed.  Access:   Right Radial Artery: 6 Fr Sheath - Seldinger Technique (Angiocath Micropuncture Kit)  Radial Cocktail - 10 mL; IV Heparin 4000 Units    Left Heart Catheterization: 5Fr Catheters advanced or exchanged over a long exchange safety J-wire under fluoroscopic guidance; TIG 4.0 catheter advanced first.   Left & Right Coronary Artery Cineangiography: TIG 4.0 Catheter   LV Hemodynamics (LV Gram): Angled pigtail  Sheath removed in the cardiac catheterization lab with TR band placement for hemostasis.  TR  Band: 1050 Hours; 14 mL air  FINDINGS:  Hemodynamics:   Central Aortic Pressure / Mean: 90/60/70 mmHg  Left Ventricular Pressure / LVEDP: 89/7/18 mmHg  Left Ventriculography:  EF: ~30 %  Wall Motion: grossly hypokinetic with diffuse mid to apical anterior and inferoapical Akinesis.  Coronary Anatomy:  Dominance: Right  Left Main: Large caliber, bifurcates into LAD & Circumflex. Angiographically normal LAD: Normal caliber vessel with a very proximal first diagonal branch. Just beyond the diagonal branch there is a stent that itself is widely patent. However in the very proximal edge of the stent there is a slight step up and then a area of contrast staining that suggests possible proximal edge tear of the stent. There is TIMI 1 flow distally down the LAD that gives off 2 more diagonal branches that are smaller in caliber than the first diagonal branch. The LAD reaches down around the apex.  D1: Moderate to large caliber very proximal vessel that bifurcates in the mid vessel. Angiographic normal. Not involved with the lesion.  D2&3: Small moderate caliber vessels from the mid and distal LAD that are free of disease. Left Circumflex: Large-caliber, nondominant vessel it gives rise a very proximal OM1 and then the mid vessel OM 2. Then terminates distally as a posterolateral branch from the AV groove. Angiographically normal vessels.  OM1: Large-caliber vessel. Courses is a high ramus intermedius. After Clearence Cheek in the mid vessel. Angiographically normal.  OM 2: Moderate caliber vessel from the mid circumflex. And grossly normal.   RCA: Are just caliber, dominant vessel that terminates as the  Right Posterior Descending Artery (PDA), gives rise to small Right Posterior AV Groove Branch (RPAV) that only has 2 small posterolateral branches and the AV nodal artery.. The PDA remains at least moderate large-caliber distally on most all the way to the apex.  After reviewing the initial  angiography, the culprit lesion was thought to be the pre-stent lesion with concerning contrast "hand-up that suggests proximal stent edge dissection. Preparation were made to proceed with PCI on this lesion. Given the location, teh proximal Diag 1 will need to be jailed.,   Percutaneous Coronary Intervention: Lesion ~hazy 70% focal early mid LAD with TIMI 1 flow --> 0% with TIMI 3 flow. Guide: 6 Fr XB LAD 3.5Guidewire: BMW  Stent: Xience Alpine DES 3.5 mm x 12 mm; overlapping ~4 mm into the proximal edge of previous stent (Xience 3.5 mm x 18 mm)  16 Atm x 30 Sec - final diameter 3.8 mm Post-dilation Balloon: [place] 4.0 mm x 8 mm; At stent overlap  12 Atm x 45 Sec; Final Diameter: 4.0 mm  Post deployment angiography in multiple views, with and without guidewire in place revealed excellent stent deployment and lesion coverage. There was no evidence of dissection or perforation.  MEDICATIONS:  Anesthesia: Local Lidocaine 2 ml  Sedation: 2 mg IV Versed, 75 mcg IV fentanyl ;   Omnipaque Contrast: 200 ml  Anticoagulation:   IV Heparin 4000 Units ;   Angiomax Bolus & drip - complete bag  Anti-Platelet Agent: Brilinta 180 mg  Radial Cocktail: 5 mg Verapamil, 400 mcg NTG, 2 ml 2% Lidocaine in 10 ml NS  IC NTG 200 mcg x 2  PATIENT DISPOSITION:   The patient was transferred to the PACU holding area in a hemodynamicaly stable, chest pain free condition.  The patient tolerated the procedure well, and there were no complications. EBL: < 15 ml  The patient was stable before, during, and after the procedure.  POST-OPERATIVE DIAGNOSIS:   Single Vessel CAD with patent mid LAD stent that has a focal proximal stent edge dissection with TIMI 1 flow - treated successfully with proximal coverage using a Xience Alpine DES 3.5 mm x 12 mm  Severe Ischemic Cardiomyopathy with extensive LAD distribution hypo-to-Akinesis & EF ~30%.  Moderately elevated LVEDP  Otherwise  minimal CAD in the remaining distribution.  PLAN OF CARE:  Standard Radial Cath Care with TR Band removal  DAPT x minimal of 1 yr (unless Brilinta continued alone after 3 months)- would continue at least Plavix indefinitely  Would consider titration of Cardiomyopathy Rx   Consider discharge in AM, but would prefer to have medication regimen for Cardiomyopathy titrated.    Leonie Man, M.D., M.S. Interventional Cardiologist  Cardiac Panel (last 3 results) No results for input(s): CKTOTAL, CKMB, TROPONINI, RELINDX in the last 72 hours.  Treatments:  See above  Discharge Exam: Blood pressure 99/65, pulse 63, temperature 97.6 F (36.4 C), temperature source Oral, resp. rate 15, height 6' 2" (1.88 m), weight 159 lb 13.3 oz (72.5 kg), SpO2 99 %.   Disposition: 01-Home or Self Care      Discharge Instructions    Diet - low sodium heart healthy    Complete by:  As directed      Discharge instructions    Complete by:  As directed   No lifting with right arm for three days.     Increase activity slowly    Complete by:  As directed  Medication List    STOP taking these medications        atenolol 50 MG tablet  Commonly known as:  TENORMIN     clopidogrel 75 MG tablet  Commonly known as:  PLAVIX      TAKE these medications        aspirin 81 MG tablet  Take 81 mg by mouth daily.  Notes to Patient:  05/05/14     nitroGLYCERIN 0.4 MG SL tablet  Commonly known as:  NITROSTAT  Place 1 tablet (0.4 mg total) under the tongue every 5 (five) minutes x 3 doses as needed for chest pain.  Notes to Patient:  NEW MEDICINE     simvastatin 20 MG tablet  Commonly known as:  ZOCOR  Take 1 tablet (20 mg total) by mouth every evening.     ticagrelor 90 MG Tabs tablet  Commonly known as:  BRILINTA  Take 1 tablet (90 mg total) by mouth 2 (two) times daily.  Notes to Patient:  NEW MEDICINE       Follow-up Information    Follow up with [facility]     On 05/06/2014.   Why:  Appointment made for PCP/ medications, @ 3:00pm on 05/06/2014   Contact information:   [address] [place] [address] [phone]      Follow up with Richardson Dopp, PA-C On 05/27/2014.   Specialties:  Physician Assistant, Radiology, Interventional Cardiology   Why:  9:45 AM   Contact information:   [address]  [place] [address] [phone]      Greater than 30 minutes was spent completing the patient's discharge.   SignedTarri Hatfield, [place] 05/13/2014, 2:15 PM

## 2014-05-04 NOTE — Progress Notes (Signed)
CARDIAC REHAB PHASE I   PRE:  Rate/Rhythm: 68 SR  BP:  Supine: 99/65  Sitting:   Standing:    SaO2:   MODE:  Ambulation: 800 ft   POST:  Rate/Rhythm: 72 SR  BP:  Supine:   Sitting: 116/80  Standing:    SaO2:  0850-0950 Pt walked 800 ft with steady gait. Tolerated well. No CP. MI education completed with pt who voiced understanding. Also reviewed CHF booklet and gave low sodium handouts due to low EF. Pt stated he has scales at home and will weigh daily. Gave pt fake cigarette and smoking cessation handouts. He plans on using the nicotine patches as he is wearing one here at the hospital. Declined CRP 2 due to work hours. Pt walks to work and back which is 30 mins each way. Gave walking instructions to get back safely to increasing distance. Has brilinta booklet and stressed importance with stent. Pt stated ready to make changes this time.   Luetta Nuttingharlene Keygan Dumond, RN BSN  05/04/2014 9:44 AM

## 2014-05-04 NOTE — Progress Notes (Signed)
    Subjective: CP  Objective: Vital signs in last 24 hours: Temp:  [97.6 F (36.4 C)-98.5 F (36.9 C)] 97.6 F (36.4 C) (04/27 0700) Pulse Rate:  [53-65] 63 (04/27 0700) Resp:  [12-25] 18 (04/27 0700) BP: (90-125)/(58-87) 106/85 mmHg (04/27 0700) SpO2:  [48 %-100 %] 99 % (04/27 0700) Weight:  [159 lb 13.3 oz (72.5 kg)] 159 lb 13.3 oz (72.5 kg) (04/27 0004) Last BM Date: 05/01/14  Intake/Output from previous day: 04/26 0701 - 04/27 0700 In: 1209.4 [P.O.:440; I.V.:769.4] Out: 2900 [Urine:2900] Intake/Output this shift: Total I/O In: 240 [P.O.:240] Out: 400 [Urine:400]  Medications Scheduled Meds: . aspirin EC  81 mg Oral Daily  . atenolol  50 mg Oral Daily  . nicotine  14 mg Transdermal Daily  . ticagrelor  90 mg Oral BID   Continuous Infusions:  PRN Meds:.morphine injection, nitroGLYCERIN, ondansetron (ZOFRAN) IV  PE: General appearance: alert, cooperative and no distress Lungs: clear to auscultation bilaterally Heart: regular rate and rhythm, S1, S2 normal, no murmur, click, rub or gallop Extremities: No LEE Pulses: 2+ and symmetric Skin: Warm and dry Neurologic: Grossly normal  Lab Results:   Recent Labs  05/02/14 0842 05/03/14 0348  WBC 6.5 6.7  HGB 15.9 15.2  HCT 46.5 45.2  PLT 269 261   BMET  Recent Labs  05/02/14 0842 05/03/14 0348 05/04/14 0343  NA 137 138 135  K 3.8 4.3 4.7  CL 103 105 105  CO2 24 26 25   GLUCOSE 77 109* 108*  BUN 9 10 9   CREATININE 0.95 0.92 0.89  CALCIUM 9.2 8.7 8.8   PT/INR  Recent Labs  05/03/14 0348  LABPROT 14.0  INR 1.07   Cholesterol  Recent Labs  05/03/14 0348  CHOL 149   Lipid Panel     Component Value Date/Time   CHOL 149 05/03/2014 0348   TRIG 66 05/03/2014 0348   HDL 44 05/03/2014 0348   CHOLHDL 3.4 05/03/2014 0348   VLDL 13 05/03/2014 0348   LDLCALC 92 05/03/2014 0348    Cardiac Panel (last 3 results)  Recent Labs  05/02/14 0842  TROPONINI 1.16*       Assessment/Plan   Active Problems:   Unstable angina   Non-STEMI (non-ST elevated myocardial infarction)   CAD S/P percutaneous coronary angioplasty   Tobacco abuse  SP left heart cath via radial approach revealing single Vessel CAD with patent mid LAD stent that has a focal proximal stent edge dissection with TIMI 1 flow - treated successfully with proximal coverage using a Xience Alpine DES 3.5 mm x 12 mm.  ASA, brilinta, atenolol  Severe Ischemic Cardiomyopathy with extensive LAD distribution hypo-to-Akinesis & EF ~30%. Moderately elevated LVEDP  Otherwise minimal CAD in the remaining distribution.  Echo being completed right now.   10 minutes of CP last night.  NTG given with resolution 10 minutes later.   Bp on the low side.    DC home today after ambulation with CR   LOS: 2 days    HAGER, BRYAN PA-C 05/04/2014 8:41 AM  Personally seen and examined. Agree with above. EF 35-40% range prelim Mild angina earlier. Will ambulate. BP soft. No Imdur now.  Lungs clear, RRR Cath site appears normal  DC home today anticipated  Donato SchultzSKAINS, Marge Vandermeulen, MD

## 2014-05-04 NOTE — Progress Notes (Signed)
Echocardiogram 2D Echocardiogram has been performed.  Dorothey BasemanReel, Luc M 05/04/2014, 8:23 AM

## 2014-05-06 ENCOUNTER — Ambulatory Visit: Payer: Self-pay | Attending: Family Medicine | Admitting: Family Medicine

## 2014-05-06 ENCOUNTER — Encounter: Payer: Self-pay | Admitting: Family Medicine

## 2014-05-06 VITALS — BP 99/67 | HR 89 | Temp 98.8°F | Resp 18 | Ht 74.0 in | Wt 162.0 lb

## 2014-05-06 DIAGNOSIS — I2510 Atherosclerotic heart disease of native coronary artery without angina pectoris: Secondary | ICD-10-CM

## 2014-05-06 DIAGNOSIS — Z9861 Coronary angioplasty status: Secondary | ICD-10-CM

## 2014-05-06 MED ORDER — NICOTINE 14 MG/24HR TD PT24: 14.0000 mg | MEDICATED_PATCH | Freq: Every day | TRANSDERMAL | Status: DC

## 2014-05-06 MED ORDER — ATENOLOL 25 MG PO TABS: 25.0000 mg | ORAL_TABLET | Freq: Every day | ORAL | Status: DC

## 2014-05-06 NOTE — Patient Instructions (Signed)
Nicotine Addiction Nicotine can act as both a stimulant (excites/activates) and a sedative (calms/quiets). Immediately after exposure to nicotine, there is a "kick" caused in part by the drug's stimulation of the adrenal glands and resulting discharge of adrenaline (epinephrine). The rush of adrenaline stimulates the body and causes a sudden release of sugar. This means that smokers are always slightly hyperglycemic. Hyperglycemic means that the blood sugar is high, just like in diabetics. Nicotine also decreases the amount of insulin which helps control sugar levels in the body. There is an increase in blood pressure, breathing, and the rate of heart beats.  In addition, nicotine indirectly causes a release of dopamine in the brain that controls pleasure and motivation. A similar reaction is seen with other drugs of abuse, such as cocaine and heroin. This dopamine release is thought to cause the pleasurable sensations when smoking. In some different cases, nicotine can also create a calming effect, depending on sensitivity of the smoker's nervous system and the dose of nicotine taken. WHAT HAPPENS WHEN NICOTINE IS TAKEN FOR LONG PERIODS OF TIME?  Long-term use of nicotine results in addiction. It is difficult to stop.  Repeated use of nicotine creates tolerance. Higher doses of nicotine are needed to get the "kick." When nicotine use is stopped, withdrawal may last a month or more. Withdrawal may begin within a few hours after the last cigarette. Symptoms peak within the first few days and may lessen within a few weeks. For some people, however, symptoms may last for months or longer. Withdrawal symptoms include:   Irritability.  Craving.  Learning and attention deficits.  Sleep disturbances.  Increased appetite. Craving for tobacco may last for 6 months or longer. Many behaviors done while using nicotine can also play a part in the severity of withdrawal symptoms. For some people, the feel,  smell, and sight of a cigarette and the ritual of obtaining, handling, lighting, and smoking the cigarette are closely linked with the pleasure of smoking. When stopped, they also miss the related behaviors which make the withdrawal or craving worse. While nicotine gum and patches may lessen the drug aspects of withdrawal, cravings often persist. WHAT ARE THE MEDICAL CONSEQUENCES OF NICOTINE USE?  Nicotine addiction accounts for one-third of all cancers. The top cancer caused by tobacco is lung cancer. Lung cancer is the number one cancer killer of both men and women.  Smoking is also associated with cancers of the:  Mouth.  Pharynx.  Larynx.  Esophagus.  Stomach.  Pancreas.  Cervix.  Kidney.  Ureter.  Bladder.  Smoking also causes lung diseases such as lasting (chronic) bronchitis and emphysema.  It worsens asthma in adults and children.  Smoking increases the risk of heart disease, including:  Stroke.  Heart attack.  Vascular disease.  Aneurysm.  Passive or secondary smoke can also increase medical risks including:  Asthma in children.  Sudden Infant Death Syndrome (SIDS).  Additionally, dropped cigarettes are the leading cause of residential fire fatalities.  Nicotine poisoning has been reported from accidental ingestion of tobacco products by children and pets. Death usually results in a few minutes from respiratory failure (when a person stops breathing) caused by paralysis. TREATMENT   Medication. Nicotine replacement medicines such as nicotine gum and the patch are used to stop smoking. These medicines gradually lower the dosage of nicotine in the body. These medicines do not contain the carbon monoxide and other toxins found in tobacco smoke.  Hypnotherapy.  Relaxation therapy.  Nicotine Anonymous (a 12-step support   program). Find times and locations in your local yellow pages. Document Released: 08/30/2003 Document Revised: 03/18/2011 Document  Reviewed: 02/19/2013 ExitCare Patient Information 2015 [employer]. This information is not intended to replace advice given to you by your health care provider. Make sure you discuss any questions you have with your health care provider.  

## 2014-05-06 NOTE — Progress Notes (Signed)
Patient had heart attack Sunday night, had 2nd stent placed. 1st stent placed in 2012. Patient reports medications were changed. Here for follow up. Patient would like nicotine patches. Patient only has 30 day supply of atenolol and simvastatin.

## 2014-05-06 NOTE — Progress Notes (Signed)
Subjective:    Patient ID: Kyle Pittman, male    DOB: 1955-10-31, 59 y.o.   MRN: [id number]  HPI  Admit Date:05/02/14 Discharge Date: 05/04/14  Kyle Pittman was admitRemi [facility] for evaluation of chest pain and shortness of breath. PMH is notable for CAD s/p stent placement in 2012.He had been out of his medications ever since his insurance ran out. He was admitted and placed on IV heparin and nitroglycerin. Troponin was 1.16. He was taken to the cath lab and coronary angiography revealed single vessel CAD with patent mid LAD stent that has a focal proximal stent edge dissection with TIMI 1 flow. This was treated successfully with proximal coverage using a drug eluting stent; it also revealed severe Ischemic Cardiomyopathy with extensive LAD distribution hypo-to-Akinesis & EF ~30%. Moderately elevated LVEDP. Otherwise minimal CAD in the remaining distribution. The patient was started on ASA, brilinta and atenolol.   Interval history: He reports doing well and feeling better than he did prior to his cardiac cath; he would need prescriptions for nicotine patch to help him quit smoking. He would also need a note as to when he can return to work as he works in a [employer] and does a lot of heavy lifting.  Past Medical History  Diagnosis Date  . MI (myocardial infarction)   . Coronary artery disease   . High cholesterol     Past Surgical History  Procedure Laterality Date  . Left heart catheterization with coronary angiogram N/A 05/03/2014    Procedure: LEFT HEART CATHETERIZATION WITH CORONARY ANGIOGRAM;  Surgeon: Marykay Lexavid W Harding, MD;  [facility];  Service: Cardiovascular;  Laterality: N/A;  . Percutaneous coronary stent intervention (pci-s)  05/03/2014    Procedure: PERCUTANEOUS CORONARY STENT INTERVENTION (PCI-S);  Surgeon: Marykay Lexavid W Harding, MD;  [facility];  Service: Cardiovascular;;    History   Social History  . Marital Status: Single    Spouse  Name: N/A  . Number of Children: N/A  . Years of Education: N/A   Occupational History  . Not on file.   Social History Main Topics  . Smoking status: Former Smoker    Types: Cigarettes    Quit date: 05/02/2014  . Smokeless tobacco: Not on file  . Alcohol Use: Yes     Comment: occ  . Drug Use: No  . Sexual Activity: Not on file   Other Topics Concern  . Not on file   Social History Narrative    Family History  Problem Relation Age of Onset  . Emphysema Mother   . Hypertension Mother   . Cancer Father     No Known Allergies  Current Outpatient Prescriptions on File Prior to Visit  Medication Sig Dispense Refill  . aspirin 81 MG tablet Take 81 mg by mouth daily.    . nitroGLYCERIN (NITROSTAT) 0.4 MG SL tablet Place 1 tablet (0.4 mg total) under the tongue every 5 (five) minutes x 3 doses as needed for chest pain. 25 tablet 12  . simvastatin (ZOCOR) 20 MG tablet Take 1 tablet (20 mg total) by mouth every evening. 30 tablet 11  . ticagrelor (BRILINTA) 90 MG TABS tablet Take 1 tablet (90 mg total) by mouth 2 (two) times daily. 60 tablet 11   No current facility-administered medications on file prior to visit.      Review of Systems  Constitutional: Negative for activity change and appetite change.  HENT: Negative for sinus pressure and sore throat.  Eyes: Negative for visual disturbance.  Respiratory: Negative for chest tightness and shortness of breath.   Cardiovascular: Negative for chest pain and palpitations.  Gastrointestinal: Negative for abdominal pain and abdominal distention.  Endocrine: Negative for cold intolerance, heat intolerance and polyphagia.  Genitourinary: Negative for dysuria, frequency and difficulty urinating.  Musculoskeletal: Negative for back pain, joint swelling and arthralgias.  Skin: Negative for color change.  Neurological: Negative for dizziness, tremors and weakness.  Psychiatric/Behavioral: Negative for suicidal ideas and behavioral  problems.       Objective: Filed Vitals:   05/06/14 1449  BP: 99/67  Pulse: 89  Temp: 98.8 F (37.1 C)  Resp: 18      Physical Exam  Constitutional: He is oriented to person, place, and time. He appears well-developed and well-nourished.  HENT:  Head: Normocephalic and atraumatic.  Right Ear: External ear normal.  Left Ear: External ear normal.  Eyes: Conjunctivae and EOM are normal. Pupils are equal, round, and reactive to light.  Neck: Normal range of motion. Neck supple. No tracheal deviation present.  Cardiovascular: Normal rate, regular rhythm and normal heart sounds.   No murmur heard. Pulmonary/Chest: Effort normal and breath sounds normal. No respiratory distress. He has no wheezes. He exhibits no tenderness.  Abdominal: Soft. Bowel sounds are normal. He exhibits no mass. There is no tenderness.  Musculoskeletal: Normal range of motion. He exhibits no edema or tenderness.  Neurological: He is alert and oriented to person, place, and time.  Skin: Skin is warm and dry.  Right wrist with dressing over site of radial cath.  Psychiatric: He has a normal mood and affect.           Assessment & Plan:  59 year old male with a history of coronary artery disease status post stent placement 2 (2012, 04/2014), severe ischemic cardiomyopathy, hypertension here to for follow-up after stent placement and is currently on dual anti-platelet therapy.  Hypertension: Blood pressure is on the low side and so I am decreasing the dose of his Atenolol from 50 to 25 mg Blood pressure will be reassessed at his next office visit.  CAD s/p cardiac cath Continue dual antiplatelet therapy with Brilinta and ASA His job is intense and involves heavy lifting and I have put him out of work until cleared by cardiology to return.  Ischemic cardiomyopathy: EF 30% Keep appointment with Cardiology  Tobacco abuse: Smoking cessation support: smoking cessation hotline: 1-800-QUIT-NOW.  Smoking  cessation classes are available through [facility]. Call [phone] or visit our website at [email or web address].  Spent 3 minutes counseling on smoking cessation and patient is ready to quit. Placed on Nicotine patches

## 2014-05-27 ENCOUNTER — Encounter: Payer: Self-pay | Admitting: Physician Assistant

## 2014-05-27 ENCOUNTER — Ambulatory Visit (INDEPENDENT_AMBULATORY_CARE_PROVIDER_SITE_OTHER): Payer: Self-pay | Admitting: Physician Assistant

## 2014-05-27 VITALS — BP 102/82 | HR 77 | Ht 74.0 in | Wt 163.0 lb

## 2014-05-27 DIAGNOSIS — R072 Precordial pain: Secondary | ICD-10-CM

## 2014-05-27 DIAGNOSIS — I255 Ischemic cardiomyopathy: Secondary | ICD-10-CM

## 2014-05-27 DIAGNOSIS — Z9861 Coronary angioplasty status: Secondary | ICD-10-CM

## 2014-05-27 DIAGNOSIS — Z720 Tobacco use: Secondary | ICD-10-CM

## 2014-05-27 DIAGNOSIS — E785 Hyperlipidemia, unspecified: Secondary | ICD-10-CM

## 2014-05-27 DIAGNOSIS — I2510 Atherosclerotic heart disease of native coronary artery without angina pectoris: Secondary | ICD-10-CM

## 2014-05-27 DIAGNOSIS — R51 Headache: Secondary | ICD-10-CM

## 2014-05-27 DIAGNOSIS — R519 Headache, unspecified: Secondary | ICD-10-CM

## 2014-05-27 DIAGNOSIS — I5022 Chronic systolic (congestive) heart failure: Secondary | ICD-10-CM

## 2014-05-27 NOTE — Progress Notes (Signed)
Cardiology Office Note   Date:  05/27/2014   ID:  Kyle Pittman, DOB 09-Jan-1955, MRN [id number]  PCP:  No PCP Per Patient  Cardiologist:  Dr. Delane Ginger     Chief Complaint  Patient presents with  . Hospitalization Follow-up    Non-STEMI  . Coronary Artery Disease     History of Present Illness: Kyle Pittman is a 59 y.o. male with a hx of CAD status post myocardial infarction in 2012 while in [place] treated with a DES to the proximal LAD, tobacco abuse. He was admitted 4/25-4/27 with a non-STEMI. The patient had been off of all his medications for a month. LHC demonstrated single-vessel CAD with a focal proximal stent edge dissection in the LAD. This was treated with a Xience DES. Ejection fraction was 45-50% by echocardiogram and 30% by left ventriculogram. Post PCI course was uneventful. He returns for follow-up.  Patient is here by himself today. He tells me that he was having chest discomfort off and on with exertion after leaving the hospital. He would take nitroglycerin with relief. This last occurred last week. He has noted some dyspnea with exertion since. Overall, he is NYHA 2-2b. He denies syncope. He denies orthopnea, PND or edema. He has been having some headaches as well as right-sided neck discomfort. He was concerned that this may be coming from his medications.  Studies/Reports Reviewed Today:  LHC 05/03/14 EF 30%, apical anterior HK and inferoapical akinesis LM: Normal LAD: Proximal stent patent with focal stent edge dissection LCx: Normal RCA: Normal PCI: 3.5 x 12 mm Xience Alpine DES to the LAD   Echo 05/04/14 - EF 45% to 50%.Limited visualization however the anteroseptal wall appearshypokinetic. Grade 1 diastolic dysfunction). - Aortic valve: Mildly calcified annulus. Trileaflet; mildlythickened leaflets. Valve area (VTI): 2.51 cm^2. Valve area(Vmax): 3.05 cm^2. - Mitral valve: Mildly calcified annulus. Mildly thickened leaflets. There was mild  regurgitation. - Left atrium: The atrium was mildly to moderately dilated. - Right atrium: The atrium was mildly dilated.   Past Medical History  Diagnosis Date  . MI (myocardial infarction)   . Coronary artery disease   . High cholesterol     Past Surgical History  Procedure Laterality Date  . Left heart catheterization with coronary angiogram N/A 05/03/2014    Procedure: LEFT HEART CATHETERIZATION WITH CORONARY ANGIOGRAM;  Surgeon: Marykay Lex, MD;  Location: [facility] C[facility] LAB;  Service: Cardiovascular;  Laterality: N/A;  . Percutaneous coronary stent intervention (pci-s)  05/03/2014    Procedure: PERCUTANEOUS CORONARY STENT INTERVENTION (PCI-S);  Surgeon: Marykay Lex, MD;  Location: [facility] LAB;  Service: Cardiovascular;;     Current Outpatient Prescriptions  Medication Sig Dispense Refill  . aspirin 81 MG tablet Take 81 mg by mouth daily.    Marland Kitchen atenolol (TENORMIN) 25 MG tablet Take 1 tablet (25 mg total) by mouth daily. 30 tablet 2  . nitroGLYCERIN (NITROSTAT) 0.4 MG SL tablet Place 1 tablet (0.4 mg total) under the tongue every 5 (five) minutes x 3 doses as needed for chest pain. 25 tablet 12  . simvastatin (ZOCOR) 20 MG tablet Take 1 tablet (20 mg total) by mouth every evening. 30 tablet 11  . ticagrelor (BRILINTA) 90 MG TABS tablet Take 1 tablet (90 mg total) by mouth 2 (two) times daily. 60 tablet 11   No current facility-administered medications for this visit.    Allergies:   Review of patient's allergies indicates no known allergies.    Social History:  The  patient  reports that he quit smoking about 3 weeks ago. His smoking use included Cigarettes. He does not have any smokeless tobacco history on file. He reports that he drinks alcohol. He reports that he does not use illicit drugs.   Family History:  The patient's family history includes Cancer in his father; Emphysema in his mother; Hypertension in his mother. There is no history of Heart attack or Stroke.     ROS:   Please see the history of present illness.   Review of Systems  HENT: Positive for headaches.   Cardiovascular: Positive for dyspnea on exertion and paroxysmal nocturnal dyspnea.  Gastrointestinal: Positive for abdominal pain.  All other systems reviewed and are negative.     PHYSICAL EXAM: VS:  BP 102/82 mmHg  Pulse 77  Ht 6\' 2"  (1.88 m)  Wt 163 lb (73.936 kg)  BMI 20.92 kg/m2    Wt Readings from Last 3 Encounters:  05/27/14 163 lb (73.936 kg)  05/06/14 162 lb (73.483 kg)  05/04/14 159 lb 13.3 oz (72.5 kg)     GEN: Well nourished, well developed, in no acute distress HEENT: normal Neck: no JVD, no masses Cardiac:  Normal S1/S2, RRR; no murmur ,  no rubs or gallops, no edema; right wrist without hematoma or mass  Respiratory:  clear to auscultation bilaterally, no wheezing, rhonchi or rales. GI: soft, nontender, nondistended, + BS MS: no deformity or atrophy Skin: warm and dry  Neuro:  CNs II-XII intact, Strength and sensation are intact Psych: Normal affect   EKG:  EKG is ordered today.  It demonstrates:   NSR, HR 77, rightward axis, anteroseptal Q waves, inf-lat TWI   Recent Labs: 05/02/2014: B Natriuretic Peptide 54.4 05/03/2014: Hemoglobin 15.2; Platelets 261 05/04/2014: BUN 9; Creatinine 0.89; Potassium 4.7; Sodium 135    Lipid Panel    Component Value Date/Time   CHOL 149 05/03/2014 0348   TRIG 66 05/03/2014 0348   HDL 44 05/03/2014 0348   CHOLHDL 3.4 05/03/2014 0348   VLDL 13 05/03/2014 0348   LDLCALC 92 05/03/2014 0348      ASSESSMENT AND PLAN:  Chest Pain:  Patient complains of some chest discomfort over the couple of weeks after discharge from the hospital responsive to nitroglycerin. I reviewed his symptoms today with Dr. Clifton JamesMcAlhany (DOD). The patient has single vessel CAD and underwent PCI to the LAD. If he were having acute stent thrombosis, he would be experiencing worse symptoms with significant EKG changes. The patient has not had  symptoms since last week. His ECG, for the most part looks unchanged. Since the patient has not had symptoms since last week, we have recommended to continue with medical therapy. His blood pressure is too low to add nitrates. He is not sure if he has been taking his aspirin. I will provide him with samples today. He does admit to compliance with Brilinta. If he has recurrent chest discomfort, we will need to consider cardiac catheterization. If his symptoms are significant, I have instructed him to go to the emergency room.  CAD S/P percutaneous coronary angioplasty:  I have given him samples of aspirin 81 mg daily. We discussed the importance of taking aspirin and Brilinta. He knows that he should contact our office if he is unable to get Brilinta. Continue statin, beta blocker. Blood pressure is too low to adjust medications.  As outlined above, proceed with cardiac catheterization if he has recurrent chest pain.  Tobacco abuse:  He has quit smoking.  Ischemic  cardiomyopathy: EF was 30% by cardiac catheterization and 45-50% by echocardiogram. Continue beta blocker. Blood pressures too low to add ACE inhibitor. I will arrange repeat echocardiogram 90 days post MI to ensure that his LV function remains > 35%.  Chronic systolic CHF (congestive heart failure):  No signs of volume excess on exam. He is NYHA 2-2b.  Hyperlipidemia:  Arrange follow-up lipids and LFTs in 4-6 weeks.   Headaches:  He has some concern that the medications he is currently taking are causing headaches. I reassured him today that the medications are not likely the cause of his headaches. As he has had some muscular neck pain, I have asked him to follow-up with primary care for further evaluation.   Current medicines are reviewed at length with the patient today.  Concerns regarding medicines are as outlined above.  The following changes have been made:    None    Labs/ tests ordered today include:  Orders Placed This  Encounter  Procedures  . Lipid Profile  . Hepatic function panel  . EKG 12-Lead  . Echocardiogram    Disposition:   FU with Dr. Elease HashimotoNahser or me in 4-6 weeks.   Signed, Brynda RimScott Annalisia Ingber, PA-C, MHS 05/27/2014 10:47 AM    [facility] [address], [place], [place]  [address] Phone: [phone]; Fax: [phone]

## 2014-05-27 NOTE — Patient Instructions (Addendum)
Medication Instructions:  MAKE SURE TO TAKE ASPIRIN 81 MG DAILY  Labwork: 1. 4-6 WEEKS FOR FASTING LIPID AND LIVER PANEL CAN GET DONE THE SAME DAY AS FOLLOW UP APPT  Testing/Procedures: Your physician has requested that you have an echocardiogram THIS IS TO BE DONE AFTER 08/02/14. Echocardiography is a painless test that uses sound waves to create images of your heart. It provides your doctor with information about the size and shape of your heart and how well your heart's chambers and valves are working. This procedure takes approximately one hour. There are no restrictions for this procedure.  Follow-Up: 4-6 WEEKS SCOTT WEAVER, PAC SAME DAY DR. Melburn PopperNASHER IS IN THE OFFICE  Any Other Special Instructions Will Be Listed Below (If Applicable). REFERRED TO CARDIAC REHAB AT [place]

## 2014-05-30 ENCOUNTER — Other Ambulatory Visit: Payer: Self-pay | Admitting: *Deleted

## 2014-05-30 MED ORDER — AMBULATORY NON FORMULARY MEDICATION: 90.0000 mg | Freq: Two times a day (BID) | Status: DC

## 2014-05-30 MED ORDER — AMBULATORY NON FORMULARY MEDICATION: 81.0000 mg | Freq: Every day | Status: DC

## 2014-06-02 ENCOUNTER — Ambulatory Visit: Payer: MEDICAID | Attending: Internal Medicine

## 2014-06-02 ENCOUNTER — Telehealth (HOSPITAL_COMMUNITY): Payer: Self-pay | Admitting: *Deleted

## 2014-06-10 ENCOUNTER — Other Ambulatory Visit: Payer: Self-pay

## 2014-06-17 ENCOUNTER — Ambulatory Visit: Payer: Self-pay | Attending: Internal Medicine | Admitting: Clinical

## 2014-06-17 DIAGNOSIS — Z659 Problem related to unspecified psychosocial circumstances: Secondary | ICD-10-CM

## 2014-06-17 NOTE — Progress Notes (Signed)
ASSESSMENT: Pt currently experiencing impending risk of homelessness. Pt would benefit from community resources and supportive counseling to cope with psychosocial stressors. Stage of Change: action  PLAN: 1. F/U with behavioral health consultant in as needed 2. Psychiatric Medications: none. 3. Behavioral recommendation(s):   -Consider f/u with [facility]  -Consider calling [employer] housing hotline -Consider contacting [facility] for help with rent -Consider making appointment with [employer] for emergency housing SUBJECTIVE: Pt. referred by unknown for community resources:  Pt. here for initial consultation regarding community resources.  Pt. reports the following symptoms/concerns: Pt states that he recently lost his job as a result of his medical condition, and that unemployment says he does not qualify for unemployment benefits because he is unable to work; he is in the process of filing for disability at this time.  Duration of problem: <2 weeks Severity: moderate  OBJECTIVE: Orientation & Cognition: Oriented x3. Thought processes normal and appropriate to situation. Mood: appropriate Affect: appropriate Appearance: appropriate Risk of harm to self or others: no risk of harm to self or others Substance use: none Psychiatric medication use: Unchanged from prior contact. Assessments administered: psychosocial  Diagnosis: Problem related to psychosocial circumstances CPT Code: Z65.9 -------------------------------------------- Other(s) present in the room: none  Time spent with patient in exam room: 20 minutes

## 2014-07-07 NOTE — Progress Notes (Signed)
Cardiology Office Note   Date:  07/08/2014   ID:  Kyle HaggardJames Huffaker, DOB Oct 05, 1955, MRN [id number]  PCP:  No PCP Per Patient  Cardiologist:  Dr. Delane GingerPhil Nahser     Chief Complaint  Patient presents with  . Coronary Artery Disease  . Chest Pain     History of Present Illness: Kyle Pittman is a 59 y.o. male with a hx of CAD status post myocardial infarction in 2012 while in [place] treated with a DES to the proximal LAD, tobacco abuse. He was admitted 04/2014 with a non-STEMI. The patient had been off of all his medications for a month. LHC demonstrated single-vessel CAD with a focal proximal stent edge dissection in the LAD. This was treated with a Xience DES. Ejection fraction was 45-50% by echocardiogram and 30% by left ventriculogram.   I saw him in follow-up last month. He complained of chest discomfort off-and-on with exertion since leaving the hospital. He had relief with nitroglycerin. Symptoms had resolved for about a week when I saw him.  I had him continue medical Rx with close FU today.    Since last seen, he has been doing well. He denies any further chest discomfort. He does not dyspnea with exertion. However, overall, he is NYHA 2. He denies orthopnea, PND or edema. He denies syncope. He continues on Brilinta and is followed by a [employer] staff (TWILIGHT STUDY).   Studies/Reports Reviewed Today:  LHC 05/03/14 EF 30%, apical anterior HK and inferoapical akinesis LM: Normal LAD: Proximal stent patent with focal stent edge dissection LCx: Normal RCA: Normal PCI: 3.5 x 12 mm Xience Alpine DES to the LAD   Echo 05/04/14 - EF 45% to 50%.Limited visualization however the anteroseptal wall appearshypokinetic. Grade 1 diastolic dysfunction). - Aortic valve: Mildly calcified annulus. Trileaflet; mildlythickened leaflets. Valve area (VTI): 2.51 cm^2. Valve area(Vmax): 3.05 cm^2. - Mitral valve: Mildly calcified annulus. Mildly thickened leaflets. There was mild  regurgitation. - Left atrium: The atrium was mildly to moderately dilated. - Right atrium: The atrium was mildly dilated.   Past Medical History  Diagnosis Date  . MI (myocardial infarction)   . Coronary artery disease   . High cholesterol     Past Surgical History  Procedure Laterality Date  . Left heart catheterization with coronary angiogram N/A 05/03/2014    Procedure: LEFT HEART CATHETERIZATION WITH CORONARY ANGIOGRAM;  Surgeon: Marykay Lexavid W Harding, MD;  [facility];  Service: Cardiovascular;  Laterality: N/A;  . Percutaneous coronary stent intervention (pci-s)  05/03/2014    Procedure: PERCUTANEOUS CORONARY STENT INTERVENTION (PCI-S);  Surgeon: Marykay Lexavid W Harding, MD;  [facility];  Service: Cardiovascular;;     Current Outpatient Prescriptions  Medication Sig Dispense Refill  . AMBULATORY NON FORMULARY MEDICATION Take 90 mg by mouth 2 (two) times daily. Medication Name: TWILIGHT Study Drug (Brilinta) Research provides study drug    . AMBULATORY NON FORMULARY MEDICATION Take 81 mg by mouth daily. Medication Name: Medication Name: TWILIGHT Study Drug (ASA) Research provides study drug    . atenolol (TENORMIN) 25 MG tablet Take 1 tablet (25 mg total) by mouth daily. 30 tablet 2  . nitroGLYCERIN (NITROSTAT) 0.4 MG SL tablet Place 1 tablet (0.4 mg total) under the tongue every 5 (five) minutes x 3 doses as needed for chest pain. 25 tablet 12  . simvastatin (ZOCOR) 20 MG tablet Take 1 tablet (20 mg total) by mouth every evening. 30 tablet 11  . lisinopril (PRINIVIL,ZESTRIL) 2.5 MG tablet Take  1 tablet (2.5 mg total) by mouth daily. 30 tablet 11   No current facility-administered medications for this visit.    Allergies:   Review of patient's allergies indicates no known allergies.    Social History:  The patient  reports that he quit smoking about 2 months ago. His smoking use included Cigarettes. He does not have any smokeless tobacco history on file. He reports  that he drinks alcohol. He reports that he does not use illicit drugs.   Family History:  The patient's family history includes Cancer in his father; Emphysema in his mother; Hypertension in his mother. There is no history of Heart attack or Stroke.    ROS:   Please see the history of present illness.   Review of Systems  Cardiovascular: Positive for chest pain, dyspnea on exertion and leg swelling.  Gastrointestinal: Positive for diarrhea.  All other systems reviewed and are negative.     PHYSICAL EXAM: VS:  BP 109/81 mmHg  Pulse 64  Ht  (1.88 m)  Wt 162 lb (73.483 kg)  BMI 20.79 kg/m2    Wt Readings from Last 3 Encounters:  07/08/14 162 lb (73.483 kg)  05/27/14 163 lb (73.936 kg)  05/06/14 162 lb (73.483 kg)     GEN: Well nourished, well developed, in no acute distress HEENT: normal Neck: no JVD, no masses Cardiac:  Normal S1/S2, RRR; no murmur ,  no rubs or gallops, no edema  Respiratory:  clear to auscultation bilaterally, no wheezing, rhonchi or rales. GI: soft, nontender, nondistended, + BS MS: no deformity or atrophy Skin: warm and dry  Neuro:  CNs II-XII intact, Strength and sensation are intact Psych: Normal affect   EKG:  EKG is ordered today.  It demonstrates:   NSR, HR 64, rightward axis, Q waves V1-V3, inferolateral T-wave inversions, no significant change when compared to prior tracings   Recent Labs: 05/02/2014: B Natriuretic Peptide 54.4 05/03/2014: Hemoglobin 15.2; Platelets 261 05/04/2014: BUN 9; Creatinine, Ser 0.89; Potassium 4.7; Sodium 135    Lipid Panel    Component Value Date/Time   CHOL 149 05/03/2014 0348   TRIG 66 05/03/2014 0348   HDL 44 05/03/2014 0348   CHOLHDL 3.4 05/03/2014 0348   VLDL 13 05/03/2014 0348   LDLCALC 92 05/03/2014 0348      ASSESSMENT AND PLAN:  CAD S/P percutaneous coronary angioplasty:  No further chest pain. Continue statin, beta blocker.    Tobacco abuse:  He has quit smoking.  Ischemic  cardiomyopathy: EF was 30% by cardiac catheterization and 45-50% by echocardiogram. Continue beta blocker. Blood pressure was too low to add ACE inhibitor.  However, I think he could tolerate low dose Lisinopril.  Start Lisinopril 2.5 mg QD.  He does note SOB but is able to exert himself pretty well prior to getting SOB.  He does not look volume overloaded.  He is NYHA 2.  Check BMET, BNP today.  Repeat echocardiogram 90 days post MI is pending.  Check BMET 1 week after starting ACE inhibitor.    Chronic systolic CHF (congestive heart failure):  No signs of volume excess on exam. He is NYHA 2   Hyperlipidemia:  FU lipids and LFTs pending.    Current medicines are reviewed at length with the patient today.  Concerns regarding medicines are as outlined above.  The following changes have been made:   Discontinued Medications   No medications on file   Modified Medications   No medications on file  New Prescriptions   LISINOPRIL (PRINIVIL,ZESTRIL) 2.5 MG TABLET    Take 1 tablet (2.5 mg total) by mouth daily.    Labs/ tests ordered today include:  Orders Placed This Encounter  Procedures  . Basic Metabolic Panel (BMET)  . B Nat Peptide  . Basic Metabolic Panel (BMET)  . EKG 12-Lead    Disposition:   FU Dr. Delane Ginger 2-3 mos.    Signed, Brynda Rim, MHS 07/08/2014 11:24 AM    [facility] [address] [place], [place], [place]  [address] Phone: [phone]; Fax: [phone]

## 2014-07-08 ENCOUNTER — Other Ambulatory Visit (INDEPENDENT_AMBULATORY_CARE_PROVIDER_SITE_OTHER): Payer: Self-pay | Admitting: *Deleted

## 2014-07-08 ENCOUNTER — Ambulatory Visit (INDEPENDENT_AMBULATORY_CARE_PROVIDER_SITE_OTHER): Payer: Self-pay | Admitting: Physician Assistant

## 2014-07-08 ENCOUNTER — Telehealth: Payer: Self-pay | Admitting: *Deleted

## 2014-07-08 ENCOUNTER — Encounter: Payer: Self-pay | Admitting: Physician Assistant

## 2014-07-08 VITALS — BP 109/81 | HR 64 | Ht 74.0 in | Wt 162.0 lb

## 2014-07-08 DIAGNOSIS — I5022 Chronic systolic (congestive) heart failure: Secondary | ICD-10-CM

## 2014-07-08 DIAGNOSIS — I255 Ischemic cardiomyopathy: Secondary | ICD-10-CM

## 2014-07-08 DIAGNOSIS — I2510 Atherosclerotic heart disease of native coronary artery without angina pectoris: Secondary | ICD-10-CM

## 2014-07-08 DIAGNOSIS — Z9861 Coronary angioplasty status: Secondary | ICD-10-CM

## 2014-07-08 DIAGNOSIS — E785 Hyperlipidemia, unspecified: Secondary | ICD-10-CM

## 2014-07-08 LAB — HEPATIC FUNCTION PANEL
ALBUMIN: 4.2 g/dL (ref 3.5–5.2)
ALT: 31 U/L (ref 0–53)
AST: 31 U/L (ref 0–37)
Alkaline Phosphatase: 73 U/L (ref 39–117)
BILIRUBIN DIRECT: 0.1 mg/dL (ref 0.0–0.3)
Total Bilirubin: 0.4 mg/dL (ref 0.2–1.2)
Total Protein: 7.7 g/dL (ref 6.0–8.3)

## 2014-07-08 LAB — LIPID PANEL
CHOLESTEROL: 124 mg/dL (ref 0–200)
HDL: 33.2 mg/dL — ABNORMAL LOW (ref >39.00)
LDL CALC: 66 mg/dL (ref 0–99)
NONHDL: 90.8
Total CHOL/HDL Ratio: 4
Triglycerides: 122 mg/dL (ref 0.0–149.0)
VLDL: 24.4 mg/dL (ref 0.0–40.0)

## 2014-07-08 MED ORDER — LISINOPRIL 2.5 MG PO TABS: 2.5000 mg | ORAL_TABLET | Freq: Every day | ORAL | Status: DC

## 2014-07-08 NOTE — Telephone Encounter (Signed)
Pt notified of lab results with verbal understanding by phone. 

## 2014-07-08 NOTE — Patient Instructions (Signed)
Medication Instructions:  1. START LISINOPRIL 2.5 MG DAILY  Labwork: 1. BMET, BNP TODAY  2. BMET IN 1 WEEK  Testing/Procedures: NONE  Follow-Up: DR. Elease HashimotoNAHSER IN 2-3 MONTHS   Any Other Special Instructions Will Be Listed Below (If Applicable).

## 2014-07-15 ENCOUNTER — Other Ambulatory Visit (INDEPENDENT_AMBULATORY_CARE_PROVIDER_SITE_OTHER): Payer: Self-pay | Admitting: *Deleted

## 2014-07-15 DIAGNOSIS — I255 Ischemic cardiomyopathy: Secondary | ICD-10-CM

## 2014-07-15 DIAGNOSIS — I5022 Chronic systolic (congestive) heart failure: Secondary | ICD-10-CM

## 2014-07-15 LAB — BASIC METABOLIC PANEL
BUN: 14 mg/dL (ref 6–23)
CALCIUM: 9.6 mg/dL (ref 8.4–10.5)
CO2: 28 meq/L (ref 19–32)
CREATININE: 1.02 mg/dL (ref 0.40–1.50)
Chloride: 101 mEq/L (ref 96–112)
GFR: 95.91 mL/min (ref >60.00)
Glucose, Bld: 73 mg/dL (ref 70–99)
POTASSIUM: 4.2 meq/L (ref 3.5–5.1)
Sodium: 136 mEq/L (ref 135–145)

## 2014-07-15 NOTE — Addendum Note (Signed)
Addended by: Tonita PhoenixBOWDEN, ROBIN K on: 07/15/2014 09:55 AM   Modules accepted: Orders

## 2014-07-18 ENCOUNTER — Telehealth: Payer: Self-pay | Admitting: *Deleted

## 2014-07-18 NOTE — Telephone Encounter (Signed)
Pt notified of normal lab results with verbal understanding  

## 2014-07-22 ENCOUNTER — Encounter: Payer: Self-pay | Admitting: Family Medicine

## 2014-07-22 ENCOUNTER — Ambulatory Visit: Payer: Self-pay | Attending: Family Medicine | Admitting: Family Medicine

## 2014-07-22 VITALS — BP 105/67 | HR 71 | Temp 98.7°F | Resp 16 | Ht 74.0 in | Wt 161.0 lb

## 2014-07-22 DIAGNOSIS — M25522 Pain in left elbow: Secondary | ICD-10-CM | POA: Insufficient documentation

## 2014-07-22 DIAGNOSIS — Z0000 Encounter for general adult medical examination without abnormal findings: Secondary | ICD-10-CM | POA: Insufficient documentation

## 2014-07-22 DIAGNOSIS — M25422 Effusion, left elbow: Secondary | ICD-10-CM | POA: Insufficient documentation

## 2014-07-22 DIAGNOSIS — Z720 Tobacco use: Secondary | ICD-10-CM | POA: Insufficient documentation

## 2014-07-22 DIAGNOSIS — Z23 Encounter for immunization: Secondary | ICD-10-CM

## 2014-07-22 DIAGNOSIS — Z114 Encounter for screening for human immunodeficiency virus [HIV]: Secondary | ICD-10-CM | POA: Insufficient documentation

## 2014-07-22 LAB — URIC ACID: Uric Acid, Serum: 7 mg/dL (ref 4.0–7.8)

## 2014-07-22 MED ORDER — NICOTINE 7 MG/24HR TD PT24: 7.0000 mg | MEDICATED_PATCH | Freq: Every day | TRANSDERMAL | Status: DC

## 2014-07-22 MED ORDER — DICLOFENAC SODIUM 1 % TD GEL: 2.0000 g | Freq: Four times a day (QID) | TRANSDERMAL | Status: DC

## 2014-07-22 MED ORDER — NICOTINE 14 MG/24HR TD PT24: 14.0000 mg | MEDICATED_PATCH | Freq: Every day | TRANSDERMAL | Status: DC

## 2014-07-22 NOTE — Progress Notes (Signed)
   Subjective:    Patient ID: Kyle HaggardJames Pittman, male    DOB: Dec 08, 1955, 59 y.o.   MRN: [id number] CC: meet PCP, L elbow pain x one week   HPI 59 yo M with CAD recent MI presents to meet PCP  1. L elbow pain: x one week. Associated with swelling. Has had intermittent pain and swelling x one year or more. No injury. No redness. Has CAD on brilinta and ASA. Oral NSAIDs not recommended.   2. Smoking: smokes 1-2 cigs per day x 37 years. Desires to quit. Has not started patches or supplement. No cough. No CP.   Soc Hx: current light smoker  Review of Systems  Constitutional: Negative for fever and chills.  Respiratory: Negative for shortness of breath.   Cardiovascular: Negative for chest pain and leg swelling.  Musculoskeletal: Positive for joint swelling and arthralgias.      Objective:   Physical Exam BP 105/67 mmHg  Pulse 71  Temp(Src) 98.7 F (37.1 C) (Oral)  Resp 16  Ht 6\' 2"  (1.88 m)  Wt 161 lb (73.029 kg)  BMI 20.66 kg/m2  SpO2 98% General appearance: alert, cooperative and no distress Lungs: clear to auscultation bilaterally Heart: regular rate and rhythm, S1, S2 normal, no murmur, click, rub or gallop Extremities:  No LE edema L elbow with mild swelling, no warmth, erythema or tenderness       Assessment & Plan:

## 2014-07-22 NOTE — Progress Notes (Signed)
Establish Care with PCP Complaining of LT elbow swelling x1wk no hx injury

## 2014-07-22 NOTE — Patient Instructions (Addendum)
Mr. Kyle ArthursFuller  .Thank you for coming in today. It was a pleasure meeting you. I look forward to being your primary doctor.  R elbow pain:  I suspect bursitis Soak in warm water Diclofenac gel Checking uric acid  Smoking:  Nicotine patch  14 mg x 6 weeks 7 mg x 2 weeks Then quit  Smoking cessation support: smoking cessation hotline: 1-800-QUIT-NOW.  Smoking cessation classes are available through [facility]. Call 805 [phone] or visit our website at [email or web address].  F/u in 5-6 weeks for smoking cessation  Dr. Armen PickupFunches

## 2014-07-23 LAB — HIV ANTIBODY (ROUTINE TESTING W REFLEX): HIV: NONREACTIVE

## 2014-07-27 ENCOUNTER — Telehealth: Payer: Self-pay | Admitting: *Deleted

## 2014-07-27 NOTE — Telephone Encounter (Signed)
LVM to return call.

## 2014-07-27 NOTE — Telephone Encounter (Signed)
-----   Message from Dessa PhiJosalyn Funches, MD sent at 07/25/2014  9:26 AM EDT ----- Screening HIV negative, uric acid normal

## 2014-08-02 NOTE — Telephone Encounter (Signed)
Reminded patient to bring unused study drug to appointment tomorrow and new study drug will be dispensed @ echo appointment. Questions encouraged and answered.

## 2014-08-03 ENCOUNTER — Other Ambulatory Visit: Payer: Self-pay

## 2014-08-03 ENCOUNTER — Ambulatory Visit (HOSPITAL_COMMUNITY): Payer: Self-pay | Attending: Cardiology

## 2014-08-03 DIAGNOSIS — Z9861 Coronary angioplasty status: Secondary | ICD-10-CM

## 2014-08-03 DIAGNOSIS — I255 Ischemic cardiomyopathy: Secondary | ICD-10-CM

## 2014-08-03 DIAGNOSIS — I2510 Atherosclerotic heart disease of native coronary artery without angina pectoris: Secondary | ICD-10-CM

## 2014-08-03 DIAGNOSIS — F17200 Nicotine dependence, unspecified, uncomplicated: Secondary | ICD-10-CM | POA: Insufficient documentation

## 2014-08-04 ENCOUNTER — Encounter (HOSPITAL_COMMUNITY): Payer: Self-pay

## 2014-08-04 ENCOUNTER — Telehealth: Payer: Self-pay | Admitting: *Deleted

## 2014-08-04 NOTE — Telephone Encounter (Signed)
Pt notified of echo results with low EF 35% still. Pt agreeable to having cardiac MRI w/morphology at [facility]. Advised pt that Dirk Dress will cal with a date and time for MRI. Pt advised to keep appt with Dr. Elease Hashimoto 09/2104.

## 2014-08-24 ENCOUNTER — Telehealth: Payer: Self-pay | Admitting: *Deleted

## 2014-08-24 ENCOUNTER — Encounter: Payer: Self-pay | Admitting: Physician Assistant

## 2014-08-24 ENCOUNTER — Ambulatory Visit (HOSPITAL_COMMUNITY)
Admission: RE | Admit: 2014-08-24 | Discharge: 2014-08-24 | Disposition: A | Discharge status: Home / Self Care | Payer: MEDICAID | Source: Ambulatory Visit | Attending: Physician Assistant | Admitting: Physician Assistant

## 2014-08-24 DIAGNOSIS — I255 Ischemic cardiomyopathy: Secondary | ICD-10-CM

## 2014-08-24 DIAGNOSIS — I2510 Atherosclerotic heart disease of native coronary artery without angina pectoris: Secondary | ICD-10-CM

## 2014-08-24 DIAGNOSIS — Z9861 Coronary angioplasty status: Secondary | ICD-10-CM

## 2014-08-24 DIAGNOSIS — R29898 Other symptoms and signs involving the musculoskeletal system: Secondary | ICD-10-CM | POA: Insufficient documentation

## 2014-08-24 MED ORDER — GADOBENATE DIMEGLUMINE 529 MG/ML IV SOLN
24.0000 mL | Freq: Once | INTRAVENOUS | Status: AC | PRN
  Administered 2014-08-24: 24 mL via INTRAVENOUS

## 2014-08-24 NOTE — Telephone Encounter (Signed)
Pt notified of MR Card Morphology [facility] results by phone with verbal understanding. Pt asked for PA to give him something for his Medicaid Hearing coming up. I said if they need information they will send [place] a request for med release, pt said ok.

## 2014-10-04 ENCOUNTER — Other Ambulatory Visit: Payer: Self-pay | Admitting: *Deleted

## 2014-10-04 MED ORDER — AMBULATORY NON FORMULARY MEDICATION: 81.0000 mg | Freq: Every day | Status: DC

## 2014-10-07 ENCOUNTER — Encounter: Payer: Self-pay | Admitting: Cardiovascular Disease

## 2014-10-07 ENCOUNTER — Ambulatory Visit (INDEPENDENT_AMBULATORY_CARE_PROVIDER_SITE_OTHER): Payer: Self-pay | Admitting: Cardiovascular Disease

## 2014-10-07 VITALS — BP 102/70 | HR 69 | Ht 74.0 in | Wt 162.8 lb

## 2014-10-07 DIAGNOSIS — I2510 Atherosclerotic heart disease of native coronary artery without angina pectoris: Secondary | ICD-10-CM

## 2014-10-07 DIAGNOSIS — I5022 Chronic systolic (congestive) heart failure: Secondary | ICD-10-CM

## 2014-10-07 DIAGNOSIS — Z9861 Coronary angioplasty status: Secondary | ICD-10-CM

## 2014-10-07 DIAGNOSIS — E785 Hyperlipidemia, unspecified: Secondary | ICD-10-CM

## 2014-10-07 MED ORDER — CARVEDILOL 6.25 MG PO TABS: 6.2500 mg | ORAL_TABLET | Freq: Two times a day (BID) | ORAL | Status: DC

## 2014-10-07 NOTE — Patient Instructions (Addendum)
Medication Instructions:  STOP Atenolol START Carvedilol (Coreg) 6.25 mg twice daily - take 12 hours apart   Labwork: TODAY - cholesterol, liver, basic metabolic panel   Testing/Procedures: None Ordered   Follow-Up: Your physician recommends that you schedule a follow-up appointment in: 1 month with nurse for BP/pulse check - Thursday 10/27 @ 11:00  Your physician wants you to follow-up in: 6 months with Dr. Elease Hashimoto.  You will receive a reminder letter in the mail two months in advance. If you don't receive a letter, please call our office to schedule the follow-up appointment.

## 2014-10-07 NOTE — Progress Notes (Signed)
Cardiology Office Note   Date:  10/07/2014   ID:  Kyle Pittman, DOB 11/15/1955, MRN [id number]  PCP:  Lora Paula, MD  Cardiologist:   Vesta Mixer, MD   Chief Complaint  Patient presents with  . Coronary Artery Disease   Problem list 1. Coronary artery disease 2. Chronic systolic congestive heart failure-due to ischemic cardio myopathy 3. Hyperlipidemia   History of Present Illness: Kyle Pittman is a 59 y.o. male who presents for follow-up of his coronary artery disease and congestive heart failure. He was seen by Tereso Newcomer, PA several months ago. Lisinopril was added to his medical regimen for his severely depressed left ventricle systolic function.  Repeat echocardiogram revealed persistently depressed left trigger systolic function. A cardiac MRI revealed:  IMPRESSION: 1) Moderate LVE with mid and distal septal, apical and distal anterior wall akinesis. Quantitative EF 46% 2) Full thickness scar involving the above areas with RWMA;s 3) No mural apical thrombus 4) Moderate LAE 5) Normal right sided cardiac chambers 6) No pericardial effusion 7) Normal aortic root  Sept. 30, 2016: Doing OK. Still has some limitations.  Does easy yard work , stops to rests  Will check labs today  Using nicotine patches since he stopped smoking     Past Medical History  Diagnosis Date  . MI (myocardial infarction)     June 2012 and Apr 2016  . Coronary artery disease DX 2012  . High cholesterol Dx 2012  . Ischemic cardiomyopathy     a. Echo 7/16: EF 35%, inferoseptal and anteroseptal HK, apical anterior, apical inferior and apical lateral HK, grade 1 diastolic dysfunction, trivial MR, normal RV function, PASP 28 mmHg;  b. cMRI 8/16:  Mod LVE, mid and distal septal, apical, distal anterior AK (full thickness scar), EF 46%, no mural clot, mod LAE, normal R sided chambers, no effusion, normal aortic root    Past Surgical History  Procedure Laterality Date  .  Left heart catheterization with coronary angiogram N/A 05/03/2014    Procedure: LEFT HEART CATHETERIZATION WITH CORONARY ANGIOGRAM;  Surgeon: Marykay Lex, MD;  Location: [facility] CATH LAB;  Service: Cardiovascular;  Laterality: N/A;  . Percutaneous coronary stent intervention (pci-s)  05/03/2014    Procedure: PERCUTANEOUS CORONARY STENT INTERVENTION (PCI-S);  Surgeon: Marykay Lex, MD;  Location[facility] C[facility] LAB;  Service: Cardiovascular;;     Current Outpatient Prescriptions  Medication Sig Dispense Refill  . AMBULATORY NON FORMULARY MEDICATION Take 90 mg by mouth 2 (two) times daily. Medication Name: TWILIGHT Study Drug (Brilinta) Research provides study drug    . AMBULATORY NON FORMULARY MEDICATION Take 81 mg by mouth daily. Medication Name: ASA 81 mg daily or PLACEBO (TWILIGHT Study provided)    . atenolol (TENORMIN) 25 MG tablet Take 1 tablet (25 mg total) by mouth daily. 30 tablet 2  . diclofenac sodium (VOLTAREN) 1 % GEL Apply 2 g topically 4 (four) times daily. Apply to R elbow 100 g 0  . lisinopril (PRINIVIL,ZESTRIL) 2.5 MG tablet Take 1 tablet (2.5 mg total) by mouth daily. 30 tablet 11  . nicotine (NICODERM CQ - DOSED IN MG/24 HOURS) 14 mg/24hr patch Place 1 patch (14 mg total) onto the skin daily. 21 patch 2  . nicotine (NICODERM CQ - DOSED IN MG/24 HR) 7 mg/24hr patch Place 1 patch (7 mg total) onto the skin daily. 14 patch 0  . nitroGLYCERIN (NITROSTAT) 0.4 MG SL tablet Place 1 tablet (0.4 mg total) under the tongue every 5 (five)  minutes x 3 doses as needed for chest pain. 25 tablet 12  . simvastatin (ZOCOR) 20 MG tablet Take 1 tablet (20 mg total) by mouth every evening. 30 tablet 11   No current facility-administered medications for this visit.    Allergies:   Review of patient's allergies indicates no known allergies.    Social History:  The patient  reports that he quit smoking about 5 weeks ago. His smoking use included Cigarettes. He has a 3.7 pack-year smoking history. He  has never used smokeless tobacco. He reports that he drinks alcohol. He reports that he does not use illicit drugs.   Family History:  The patient's family history includes Cancer in his father; Emphysema in his mother; Hypertension in his mother. There is no history of Heart attack or Stroke.    ROS:  Please see the history of present illness.    Review of Systems: Constitutional:  denies fever, chills, diaphoresis, appetite change and fatigue.  HEENT: denies photophobia, eye pain, redness, hearing loss, ear pain, congestion, sore throat, rhinorrhea, sneezing, neck pain, neck stiffness and tinnitus.  Respiratory: admits to SOB, DOE,    Cardiovascular: denies chest pain, palpitations and leg swelling.  Gastrointestinal: denies nausea, vomiting, abdominal pain, diarrhea, constipation, blood in stool.  Genitourinary: denies dysuria, urgency, frequency, hematuria, flank pain and difficulty urinating.  Musculoskeletal: denies  myalgias, back pain, joint swelling, arthralgias and gait problem.   Skin: denies pallor, rash and wound.  Neurological: denies dizziness, seizures, syncope, weakness, light-headedness, numbness and headaches.   Hematological: denies adenopathy, easy bruising, personal or family bleeding history.  Psychiatric/ Behavioral: denies suicidal ideation, mood changes, confusion, nervousness, sleep disturbance and agitation.       All other systems are reviewed and negative.    PHYSICAL EXAM: VS:  BP 102/70 mmHg  Pulse 69  Ht  (1.88 m)  Wt 73.846 kg (162 lb 12.8 oz)  BMI 20.89 kg/m2 , BMI Body mass index is 20.89 kg/(m^2). GEN: Well nourished, well developed, in no acute distress HEENT: normal Neck: no JVD, carotid bruits, or masses Cardiac: RRR; no murmurs, rubs, or gallops,no edema  Respiratory:  clear to auscultation bilaterally, normal work of breathing GI: soft, nontender, nondistended, + BS MS: no deformity or atrophy Skin: warm and dry, no rash Neuro:   Strength and sensation are intact Psych: normal   EKG:  EKG is not ordered today.    Recent Labs: 05/02/2014: B Natriuretic Peptide 54.4 05/03/2014: Hemoglobin 15.2; Platelets 261 07/08/2014: ALT 31 07/15/2014: BUN 14; Creatinine, Ser 1.02; Potassium 4.2; Sodium 136    Lipid Panel    Component Value Date/Time   CHOL 124 07/08/2014 0936   TRIG 122.0 07/08/2014 0936   HDL 33.20* 07/08/2014 0936   CHOLHDL 4 07/08/2014 0936   VLDL 24.4 07/08/2014 0936   LDLCALC 66 07/08/2014 0936      Wt Readings from Last 3 Encounters:  10/07/14 73.846 kg (162 lb 12.8 oz)  07/22/14 73.029 kg (161 lb)  07/08/14 73.483 kg (162 lb)      Other studies Reviewed: Additional studies/ records that were reviewed today include: . Review of the above records demonstrates:    ASSESSMENT AND PLAN:  1.  CAD , status post PCI of his LAD. He's not having any angina. Continue current medications. I would suggest that he take Plavix lifelong.  He is currently on a study drug .  Ive suggested that he resume Plavix after the study is complete.   2. Mild systolic  congestive heart failure: He had an MRI which revealed an ejection fraction of 46%. ContinCoreg 6.25 g twice a day and lisinopril 2.5 mg a day. We'll titrate up the lisinopril as his blood pressure tolerates.    Current medicines are reviewed at length with the patient today.  The patient does not have concerns regarding medicines.  The following changes have been made:  no change  Labs/ tests ordered today include:  No orders of the defined types were placed in this encounter.     Disposition:   FU with me in      Vesta Mixer, MD  10/07/2014 9:36 AM    [facility] [address] [place], [place], [place]  [address] Phone: [phone]; Fax: [phone]   [facility]  [address] [place], [place]  [address] [phone]   Fax [phone]

## 2014-11-03 ENCOUNTER — Ambulatory Visit (INDEPENDENT_AMBULATORY_CARE_PROVIDER_SITE_OTHER): Payer: Self-pay | Admitting: *Deleted

## 2014-11-03 VITALS — BP 124/72 | HR 72 | Temp 97.6°F | Ht 74.0 in | Wt 167.8 lb

## 2014-11-03 DIAGNOSIS — I10 Essential (primary) hypertension: Secondary | ICD-10-CM

## 2014-11-03 NOTE — Progress Notes (Signed)
Patient was scheduled at last visit to return in a month for a nurse visit to check B/P.  B/P was 124/72 HR 76.  Patient said he was taking his meds regularly.  York SpanielSaid he has a slight cold now.  Released patient to home.

## 2015-01-31 MED FILL — LISINOPRIL 5 MG TABLET: 5 | 30 days supply | Qty: 15 | Fill #6

## 2015-01-31 MED FILL — CARVEDILOL 6.25 MG TABLET: 6.25 | 30 days supply | Qty: 60 | Fill #3

## 2015-02-03 ENCOUNTER — Encounter: Payer: Self-pay | Admitting: *Deleted

## 2015-02-03 DIAGNOSIS — Z006 Encounter for examination for normal comparison and control in clinical research program: Secondary | ICD-10-CM

## 2015-02-03 NOTE — Progress Notes (Signed)
TWILIGHT 9 month research study visit completed. Patient denies any adverse or bleeding events. States may have missed an evening dose of brilinta here and there, questions encouraged and answered. Next study visit mid June research office will call to schedule.

## 2015-02-28 MED FILL — LISINOPRIL 5 MG TABLET: 5 | 30 days supply | Qty: 15 | Fill #7

## 2015-03-02 ENCOUNTER — Telehealth: Payer: Self-pay | Admitting: *Deleted

## 2015-03-02 ENCOUNTER — Ambulatory Visit: Payer: Medicaid Other | Attending: Family Medicine | Admitting: Family Medicine

## 2015-03-02 ENCOUNTER — Other Ambulatory Visit: Payer: Self-pay | Admitting: Family Medicine

## 2015-03-02 ENCOUNTER — Encounter: Payer: Self-pay | Admitting: Family Medicine

## 2015-03-02 ENCOUNTER — Telehealth: Payer: Self-pay | Admitting: Cardiovascular Disease

## 2015-03-02 VITALS — BP 99/65 | HR 72 | Temp 98.3°F | Resp 16 | Ht 74.0 in | Wt 166.0 lb

## 2015-03-02 DIAGNOSIS — N529 Male erectile dysfunction, unspecified: Secondary | ICD-10-CM

## 2015-03-02 DIAGNOSIS — I2510 Atherosclerotic heart disease of native coronary artery without angina pectoris: Secondary | ICD-10-CM | POA: Diagnosis not present

## 2015-03-02 DIAGNOSIS — Z0000 Encounter for general adult medical examination without abnormal findings: Secondary | ICD-10-CM | POA: Diagnosis not present

## 2015-03-02 DIAGNOSIS — I5020 Unspecified systolic (congestive) heart failure: Secondary | ICD-10-CM | POA: Diagnosis not present

## 2015-03-02 DIAGNOSIS — Z87891 Personal history of nicotine dependence: Secondary | ICD-10-CM | POA: Diagnosis not present

## 2015-03-02 DIAGNOSIS — I252 Old myocardial infarction: Secondary | ICD-10-CM | POA: Insufficient documentation

## 2015-03-02 DIAGNOSIS — Z1159 Encounter for screening for other viral diseases: Secondary | ICD-10-CM | POA: Diagnosis not present

## 2015-03-02 DIAGNOSIS — Z79899 Other long term (current) drug therapy: Secondary | ICD-10-CM | POA: Insufficient documentation

## 2015-03-02 LAB — POCT GLYCOSYLATED HEMOGLOBIN (HGB A1C): HEMOGLOBIN A1C: 5.5

## 2015-03-02 MED ORDER — SILDENAFIL CITRATE 100 MG PO TABS: 50.0000 mg | ORAL_TABLET | Freq: Every day | ORAL | Status: DC | PRN

## 2015-03-02 NOTE — Telephone Encounter (Signed)
Dr.Josalyn Funches calling from [facility].  States she saw Kyle Pittman today and he is requesting Rx for Viagra.  She is hesitant to give since BP is low; today 99/65.  Has Rx for NTG but has not taken in 5 months. She would like Dr. Harvie Bridge advice.  Advised would forward to him.  She states is not urgent

## 2015-03-02 NOTE — Telephone Encounter (Signed)
Please call,she wants to leave  Some medical information regarding this pt for Dr Elease Hashimoto.

## 2015-03-02 NOTE — Progress Notes (Signed)
F/U ED No pain today  No tobacco user  No suicidal thoughts in the past two weeks

## 2015-03-02 NOTE — Telephone Encounter (Signed)
Kyle Pittman should be ok with the viagra

## 2015-03-02 NOTE — Progress Notes (Signed)
Subjective:  Patient ID: Kyle Pittman, male    DOB: 26-Dec-1955  Age: 60 y.o. MRN: [id number]  CC: Erectile Dysfunction   HPI Kyle Pittman has CAD, systolic CHF    1. Erectile dysfunction: x 2 years. Worsened over the past 3-6 months. Previously on viagra 25 mg prn and this worked well for him. He has a second MI in 05/2014. He required NTG prn following MI, but has not required it since 06/2014. He has trouble maintaining an adequate erection. He has one long term sex partner.   Social History  Substance Use Topics  . Smoking status: Former Smoker -- 0.10 packs/day for 37 years    Types: Cigarettes    Quit date: 09/01/2014  . Smokeless tobacco: Never Used  . Alcohol Use: 0.0 oz/week    0 Standard drinks or equivalent per week     Comment: occ    Outpatient Prescriptions Prior to Visit  Medication Sig Dispense Refill  . carvedilol (COREG) 6.25 MG tablet Take 1 tablet (6.25 mg total) by mouth 2 (two) times daily. 60 tablet 11  . diclofenac sodium (VOLTAREN) 1 % GEL Apply 2 g topically 4 (four) times daily. Apply to R elbow 100 g 0  . lisinopril (PRINIVIL,ZESTRIL) 2.5 MG tablet Take 1 tablet (2.5 mg total) by mouth daily. 30 tablet 11  . simvastatin (ZOCOR) 20 MG tablet Take 1 tablet (20 mg total) by mouth every evening. 30 tablet 11  . AMBULATORY NON FORMULARY MEDICATION Take 90 mg by mouth 2 (two) times daily. Medication Name: TWILIGHT Study Drug (Brilinta) Research provides study drug (Patient not taking: Reported on 03/02/2015)    . AMBULATORY NON FORMULARY MEDICATION Take 81 mg by mouth daily. Medication Name: ASA 81 mg daily or PLACEBO (TWILIGHT Study provided) (Patient not taking: Reported on 03/02/2015)    . nitroGLYCERIN (NITROSTAT) 0.4 MG SL tablet Place 1 tablet (0.4 mg total) under the tongue every 5 (five) minutes x 3 doses as needed for chest pain. (Patient not taking: Reported on 03/02/2015) 25 tablet 12  . nicotine (NICODERM CQ - DOSED IN MG/24 HOURS) 14 mg/24hr patch  Place 1 patch (14 mg total) onto the skin daily. (Patient not taking: Reported on 03/02/2015) 21 patch 2  . nicotine (NICODERM CQ - DOSED IN MG/24 HR) 7 mg/24hr patch Place 1 patch (7 mg total) onto the skin daily. (Patient not taking: Reported on 11/03/2014) 14 patch 0   No facility-administered medications prior to visit.    ROS Review of Systems  Constitutional: Negative for fever, chills, fatigue and unexpected weight change.  Eyes: Negative for visual disturbance.  Respiratory: Negative for cough and shortness of breath.   Cardiovascular: Negative for chest pain, palpitations and leg swelling.  Gastrointestinal: Negative for nausea, vomiting, abdominal pain, diarrhea, constipation and blood in stool.  Endocrine: Negative for polydipsia, polyphagia and polyuria.  Musculoskeletal: Negative for myalgias, back pain, arthralgias, gait problem and neck pain.  Skin: Negative for rash.  Allergic/Immunologic: Negative for immunocompromised state.  Neurological: Negative for dizziness and light-headedness.  Hematological: Negative for adenopathy. Does not bruise/bleed easily.  Psychiatric/Behavioral: Negative for suicidal ideas, sleep disturbance and dysphoric mood. The patient is not nervous/anxious.     Objective:  BP 99/65 mmHg  Pulse 72  Temp(Src) 98.3 F (36.8 C) (Oral)  Resp 16  Ht  (1.88 m)  Wt 166 lb (75.297 kg)  BMI 21.30 kg/m2  SpO2 100%  BP/Weight 03/02/2015 11/03/2014 10/07/2014  Systolic BP 99 124 102  Diastolic  BP 65 72 70  Wt. (Lbs) 166 167.8 162.8  BMI 21.3 21.54 20.89    Physical Exam  Constitutional: He appears well-developed and well-nourished. No distress.  HENT:  Head: Normocephalic and atraumatic.  Neck: Normal range of motion. Neck supple.  Cardiovascular: Normal rate, regular rhythm, normal heart sounds and intact distal pulses.   Pulmonary/Chest: Effort normal and breath sounds normal.  Musculoskeletal: He exhibits no edema.  Neurological: He is  alert.  Skin: Skin is warm and dry. No rash noted. No erythema.  Psychiatric: He has a normal mood and affect.   Lab Results  Component Value Date   HGBA1C 5.5 03/02/2015     Assessment & Plan:   Problem List Items Addressed This Visit    Healthcare maintenance   Relevant Orders   Ambulatory referral to Gastroenterology   POCT glycosylated hemoglobin (Hb A1C) (Completed)   Erectile dysfunction - Primary (Chronic)   Relevant Medications   sildenafil (VIAGRA) 100 MG tablet    Other Visit Diagnoses    Need for hepatitis C screening test        Relevant Orders    Hepatitis C antibody, reflex    Health care maintenance           No orders of the defined types were placed in this encounter.    Follow-up: No Follow-up on file.   Dessa Phi MD

## 2015-03-02 NOTE — Telephone Encounter (Signed)
Attempted to call Dr. Armen Pickup, there was no answer at [facility] physician line

## 2015-03-02 NOTE — Patient Instructions (Addendum)
Kyle Pittman was seen today for erectile dysfunction.  Diagnoses and all orders for this visit:  Erectile dysfunction, unspecified erectile dysfunction type   I have left a message for your cardiologist  Dr. Elease Hashimoto inquiring about viagra.  You will be contacted with an answer  F/u in 3 months   Dr. Armen Pickup

## 2015-03-02 NOTE — Assessment & Plan Note (Signed)
A: ED in setting of CAD and CHF. No CP. No NTG in 5 months. Patient's cardiologist has noted that viagra should be ok P: Tral of viagra Cautioned patient regarding risk of low BP Cautioned patient to not combine viagra with nitroglycerin

## 2015-03-02 NOTE — Telephone Encounter (Signed)
Noted recommendation from Dr. Elease Hashimoto, thank you.  Attempted to call patient to inform him that he start viagra. Left VM requesting a call back.

## 2015-03-03 ENCOUNTER — Encounter: Payer: Self-pay | Admitting: Clinical

## 2015-03-03 LAB — HEPATITIS C ANTIBODY: HCV AB: NEGATIVE

## 2015-03-03 NOTE — Progress Notes (Signed)
Depression screen [facility] 2/9 03/02/2015 07/22/2014 05/06/2014  Decreased Interest 0 0 0  Down, Depressed, Hopeless 0 0 0  PHQ - 2 Score 0 0 0  Altered sleeping 0 - -  Tired, decreased energy 2 - -  Change in appetite 0 - -  Feeling bad or failure about yourself  0 - -  Trouble concentrating 0 - -  Moving slowly or fidgety/restless 0 - -  Suicidal thoughts 0 - -  PHQ-9 Score 2 - -    GAD 7 : Generalized Anxiety Score 03/02/2015  Nervous, Anxious, on Edge 0  Control/stop worrying 0  Worry too much - different things 0  Trouble relaxing 0  Restless 0  Easily annoyed or irritable 0  Afraid - awful might happen 0  Total GAD 7 Score 0

## 2015-03-07 MED FILL — CARVEDILOL 6.25 MG TABLET: 6.25 | 30 days supply | Qty: 60 | Fill #4

## 2015-03-18 ENCOUNTER — Encounter (HOSPITAL_COMMUNITY): Payer: Self-pay | Admitting: Emergency Medicine

## 2015-03-18 ENCOUNTER — Emergency Department (HOSPITAL_COMMUNITY): Payer: Medicaid Other

## 2015-03-18 ENCOUNTER — Emergency Department (HOSPITAL_COMMUNITY)
Admission: EM | Admit: 2015-03-18 | Discharge: 2015-03-18 | Disposition: A | Discharge status: Home / Self Care | Payer: Medicaid Other | Attending: Emergency Medicine | Admitting: Emergency Medicine

## 2015-03-18 DIAGNOSIS — Z87891 Personal history of nicotine dependence: Secondary | ICD-10-CM | POA: Diagnosis not present

## 2015-03-18 DIAGNOSIS — I252 Old myocardial infarction: Secondary | ICD-10-CM | POA: Insufficient documentation

## 2015-03-18 DIAGNOSIS — E7800 Pure hypercholesterolemia, unspecified: Secondary | ICD-10-CM | POA: Insufficient documentation

## 2015-03-18 DIAGNOSIS — Z7982 Long term (current) use of aspirin: Secondary | ICD-10-CM | POA: Diagnosis not present

## 2015-03-18 DIAGNOSIS — M25512 Pain in left shoulder: Secondary | ICD-10-CM | POA: Diagnosis present

## 2015-03-18 DIAGNOSIS — Z79899 Other long term (current) drug therapy: Secondary | ICD-10-CM | POA: Insufficient documentation

## 2015-03-18 DIAGNOSIS — I2510 Atherosclerotic heart disease of native coronary artery without angina pectoris: Secondary | ICD-10-CM | POA: Insufficient documentation

## 2015-03-18 LAB — CBC
HEMATOCRIT: 42.1 % (ref 39.0–52.0)
HEMOGLOBIN: 14 g/dL (ref 13.0–17.0)
MCH: 28.9 pg (ref 26.0–34.0)
MCHC: 33.3 g/dL (ref 30.0–36.0)
MCV: 86.8 fL (ref 78.0–100.0)
PLATELETS: 258 10*3/uL (ref 150–400)
RBC: 4.85 MIL/uL (ref 4.22–5.81)
RDW: 14.7 % (ref 11.5–15.5)
WBC: 6.5 10*3/uL (ref 4.0–10.5)

## 2015-03-18 LAB — BASIC METABOLIC PANEL
ANION GAP: 10 (ref 5–15)
BUN: 6 mg/dL (ref 6–20)
CALCIUM: 9.3 mg/dL (ref 8.9–10.3)
CHLORIDE: 103 mmol/L (ref 101–111)
CO2: 26 mmol/L (ref 22–32)
Creatinine, Ser: 0.83 mg/dL (ref 0.61–1.24)
GFR calc Af Amer: >60 mL/min (ref >60)
GFR calc non Af Amer: >60 mL/min (ref >60)
GLUCOSE: 130 mg/dL — AB (ref 65–99)
Potassium: 3.9 mmol/L (ref 3.5–5.1)
Sodium: 139 mmol/L (ref 135–145)

## 2015-03-18 LAB — I-STAT TROPONIN, ED: Troponin i, poc: 0 ng/mL (ref 0.00–0.08)

## 2015-03-18 MED ORDER — IBUPROFEN 400 MG PO TABS
600.0000 mg | ORAL_TABLET | Freq: Once | ORAL | Status: AC
  Administered 2015-03-18: 600 mg via ORAL
  Filled 2015-03-18: qty 1

## 2015-03-18 MED ORDER — PREDNISONE 20 MG PO TABS
60.0000 mg | ORAL_TABLET | Freq: Once | ORAL | Status: AC
  Administered 2015-03-18: 60 mg via ORAL
  Filled 2015-03-18: qty 3

## 2015-03-18 MED ORDER — HYDROCODONE-ACETAMINOPHEN 5-325 MG PO TABS
1.0000 | ORAL_TABLET | Freq: Once | ORAL | Status: AC
  Administered 2015-03-18: 1 via ORAL
  Filled 2015-03-18: qty 1

## 2015-03-18 MED ORDER — PREDNISONE 10 MG PO TABS: 60.0000 mg | ORAL_TABLET | Freq: Every day | ORAL | Status: DC

## 2015-03-18 MED ORDER — IBUPROFEN 600 MG PO TABS: 600.0000 mg | ORAL_TABLET | Freq: Three times a day (TID) | ORAL | Status: DC | PRN

## 2015-03-18 NOTE — ED Notes (Signed)
C/o L shoulder pain that woke him up from sleeping.  Reports cardiac history.  Denies recent injury but states he did a lot of yard work last week.  Denies sob, nausea, and vomiting.  Pain worse with movement.

## 2015-03-19 NOTE — ED Provider Notes (Signed)
CSN: [id number]     Arrival date & time 03/18/15  [id number] History   First MD Initiated Contact with Patient 03/18/15 0500     Chief Complaint  Patient presents with  . Shoulder Pain     HPI Patient reports left shoulder pain that woke him from sleep.  He denies chest pain chest tightness.  No neck or jaw pain.  Reports his left shoulder pain is worse with range of motion of his left shoulder.  No recent injury or trauma to his left shoulder.  He did do a lot of yard work in the past 24 hours.  He thinks it may be related to this.  No fevers or chills.  Denies weakness of the left hand.  No chest pain or shortness of breath.   Past Medical History  Diagnosis Date  . MI (myocardial infarction) [facility](HCC)     June 2012 and Apr 2016  . Coronary artery disease DX 2012  . High cholesterol Dx 2012  . Ischemic cardiomyopathy     a. Echo 7/16: EF 35%, inferoseptal and anteroseptal HK, apical anterior, apical inferior and apical lateral HK, grade 1 diastolic dysfunction, trivial MR, normal RV function, PASP 28 mmHg;  b. cMRI 8/16:  Mod LVE, mid and distal septal, apical, distal anterior AK (full thickness scar), EF 46%, no mural clot, mod LAE, normal R sided chambers, no effusion, normal aortic root   Past Surgical History  Procedure Laterality Date  . Left heart catheterization with coronary angiogram N/A 05/03/2014    Procedure: LEFT HEART CATHETERIZATION WITH CORONARY ANGIOGRAM;  Surgeon: Marykay Lexavid W Harding, MD;  [facility];  Service: Cardiovascular;  Laterality: N/A;  . Percutaneous coronary stent intervention (pci-s)  05/03/2014    Procedure: PERCUTANEOUS CORONARY STENT INTERVENTION (PCI-S);  Surgeon: Marykay Lexavid W Harding, MD;  [facility];  Service: Cardiovascular;;   Family History  Problem Relation Age of Onset  . Emphysema Mother   . Hypertension Mother   . Cancer Father   . Heart attack Neg Hx   . Stroke Neg Hx    Social History  Substance Use Topics  . Smoking status: Former  Smoker -- 0.10 packs/day for 37 years    Types: Cigarettes    Quit date: 09/01/2014  . Smokeless tobacco: Never Used  . Alcohol Use: 0.0 oz/week    0 Standard drinks or equivalent per week     Comment: occ    Review of Systems  All other systems reviewed and are negative.     Allergies  Review of patient's allergies indicates no known allergies.  Home Medications   Prior to Admission medications   Medication Sig Start Date End Date Taking? Authorizing Provider  aspirin 81 MG tablet Take 81 mg by mouth daily.   Yes Historical Provider, MD  carvedilol (COREG) 6.25 MG tablet Take 1 tablet (6.25 mg total) by mouth 2 (two) times daily. 10/07/14  Yes Vesta MixerPhilip J Nahser, MD  diclofenac sodium (VOLTAREN) 1 % GEL Apply 2 g topically 4 (four) times daily. Apply to R elbow 07/22/14  Yes Josalyn Funches, MD  lisinopril (PRINIVIL,ZESTRIL) 2.5 MG tablet Take 1 tablet (2.5 mg total) by mouth daily. 07/08/14  Yes Beatrice LecherScott T Weaver, PA-C  sildenafil (VIAGRA) 100 MG tablet Take 0.5-1 tablets (50-100 mg total) by mouth daily as needed for erectile dysfunction. 03/02/15  Yes Josalyn Funches, MD  simvastatin (ZOCOR) 20 MG tablet Take 1 tablet (20 mg total) by mouth every evening. 05/04/14  Yes  Dwana Melena, PA-C  ticagrelor (BRILINTA) 60 MG TABS tablet Take 60 mg by mouth 2 (two) times daily.    Yes Historical Provider, MD  ibuprofen (ADVIL,MOTRIN) 600 MG tablet Take 1 tablet (600 mg total) by mouth every 8 (eight) hours as needed. 03/18/15   Azalia Bilis, MD  predniSONE (DELTASONE) 10 MG tablet Take 6 tablets (60 mg total) by mouth daily. 03/18/15   Azalia Bilis, MD   BP 142/93 mmHg  Pulse 78  Temp(Src) 97.9 F (36.6 C) (Oral)  Resp 15  SpO2 100% Physical Exam  Constitutional: He is oriented to person, place, and time. He appears well-developed and well-nourished.  HENT:  Head: Normocephalic and atraumatic.  Eyes: EOM are normal.  Neck: Normal range of motion.  Cardiovascular: Normal rate, regular rhythm  and normal heart sounds.   Pulmonary/Chest: Effort normal and breath sounds normal. No respiratory distress.  Abdominal: Soft. He exhibits no distension. There is no tenderness.  Musculoskeletal: Normal range of motion.  Normal left radial pulse.  Normal range of motion of left shoulder, left elbow, left wrist.  Passive range of motion of the left shoulder is normal but active range of motion causes some discomfort and pain.  No obvious deformity of left shoulder.  Normal left clavicle.  No rash of left shoulder noted.  No obvious deformity of the left shoulder.  Normal grip strength left hand.  Neurological: He is alert and oriented to person, place, and time.  Skin: Skin is warm and dry.  Psychiatric: He has a normal mood and affect. Judgment normal.  Nursing note and vitals reviewed.   ED Course  Procedures (including critical care time) Labs Review Labs Reviewed  BASIC METABOLIC PANEL - Abnormal; Notable for the following:    Glucose, Bld 130 (*)    All other components within normal limits  CBC  I-STAT TROPOININ, ED    Imaging Review Dg Chest 2 View  03/18/2015  CLINICAL DATA:  Left shoulder pain EXAM: CHEST  2 VIEW COMPARISON:  05/02/2014 FINDINGS: Normal heart size and stable mediastinal contours. Coronary stent noted. No acute infiltrate or edema. No effusion or pneumothorax. No acute osseous findings. IMPRESSION: No active cardiopulmonary disease. Electronically Signed   By: Marnee Spring M.D.   On: 03/18/2015 04:35   I have personally reviewed and evaluated these images and lab results as part of my medical decision-making.   EKG Interpretation   Date/Time:  Saturday March 18 2015 03:45:18 EST Ventricular Rate:  79 PR Interval:  212 QRS Duration: 92 QT Interval:  350 QTC Calculation: 401 R Axis:   84 Text Interpretation:  Sinus rhythm with 1st degree A-V block Anteroseptal  infarct , age undetermined T wave abnormality, consider lateral ischemia  Abnormal ECG No  significant change was found Confirmed by Kayann Maj  MD,  Caryn Bee ([id number]) on 03/18/2015 5:21:36 AM      MDM   Final diagnoses:  Left shoulder pain    Chest x-ray was completed by nursing staff I suppose for the possibility that this could represent cardiac related pain.  I do not believe this to be a presentation of acute coronary syndrome.  This appears to be a musculoskeletal issue and are located shoulder.  Portions of left shoulder are visualized on the chest x-ray and there without obvious abnormality.  His pain is with active range of motion of his left shoulder.  Patient was offered a sling for comfort for which he refused.  Home with anti-inflammatories primary care follow-up.  Normal left radial pulse.  No trauma to indicate a need for immediate plain film imaging of the left shoulder    Azalia Bilis, MD 03/19/15 0139

## 2015-03-21 ENCOUNTER — Other Ambulatory Visit: Payer: Self-pay | Admitting: *Deleted

## 2015-03-21 MED ORDER — AMBULATORY NON FORMULARY MEDICATION: 90.0000 mg | Freq: Two times a day (BID) | Status: DC

## 2015-03-21 MED ORDER — AMBULATORY NON FORMULARY MEDICATION: 81.0000 mg | Freq: Every day | Status: DC

## 2015-03-27 ENCOUNTER — Inpatient Hospital Stay: Payer: Medicaid Other | Admitting: Family Medicine

## 2015-04-03 MED FILL — LISINOPRIL 5 MG TABLET: 5 | 30 days supply | Qty: 15 | Fill #8

## 2015-04-07 ENCOUNTER — Ambulatory Visit (INDEPENDENT_AMBULATORY_CARE_PROVIDER_SITE_OTHER): Payer: Medicaid Other | Admitting: Cardiovascular Disease

## 2015-04-07 ENCOUNTER — Encounter: Payer: Self-pay | Admitting: Cardiovascular Disease

## 2015-04-07 ENCOUNTER — Ambulatory Visit: Payer: Medicaid Other | Admitting: Cardiovascular Disease

## 2015-04-07 VITALS — BP 104/70 | HR 70 | Ht 74.0 in | Wt 168.4 lb

## 2015-04-07 DIAGNOSIS — I2510 Atherosclerotic heart disease of native coronary artery without angina pectoris: Secondary | ICD-10-CM | POA: Diagnosis not present

## 2015-04-07 DIAGNOSIS — Z9861 Coronary angioplasty status: Secondary | ICD-10-CM

## 2015-04-07 DIAGNOSIS — I5022 Chronic systolic (congestive) heart failure: Secondary | ICD-10-CM | POA: Diagnosis not present

## 2015-04-07 NOTE — Patient Instructions (Signed)
Medication Instructions:  Your physician recommends that you continue on your current medications as directed. Please refer to the Current Medication list given to you today. Viagra 100 mg #15 pills have been sent to your pharmacy - you will need to contact your Primary Care doctor for future refills   Labwork: Your physician recommends that you return for lab work in: next week for fasting cholesterol, complete metabolic panel   Testing/Procedures: None Ordered   Follow-Up: Your physician wants you to follow-up in: 6 months with Dr. Elease HashimotoNahser.  You will receive a reminder letter in the mail two months in advance. If you don't receive a letter, please call our office to schedule the follow-up appointment.   If you need a refill on your cardiac medications before your next appointment, please call your pharmacy.   Thank you for choosing [facility]! Eligha BridegroomMichelle Swinyer, RN [phone]

## 2015-04-07 NOTE — Progress Notes (Signed)
Cardiology Office Note   Date:  04/07/2015   ID:  Kyle Pittman, DOB Jul 08, 1955, MRN [id number]  PCP:  Lora Paula, MD  Cardiologist:   Vesta Mixer, MD   Chief Complaint  Patient presents with  . Coronary Artery Disease    BEEN HAVING SWELLING IN SHOULDER, NO CHEST PAIN AND NO SOB.  . Follow-up   Problem list 1. Coronary artery disease 2. Chronic systolic congestive heart failure-due to ischemic cardiomyopathy 3. Hyperlipidemia   History of Present Illness: Kyle Pittman is a 60 y.o. male who presents for follow-up of his coronary artery disease and congestive heart failure. He was seen by Tereso Newcomer, PA several months ago. Lisinopril was added to his medical regimen for his severely depressed left ventricle systolic function.  Repeat echocardiogram revealed persistently depressed left trigger systolic function. A cardiac MRI revealed:  IMPRESSION: 1) Moderate LVE with mid and distal septal, apical and distal anterior wall akinesis. Quantitative EF 46% 2) Full thickness scar involving the above areas with RWMA;s 3) No mural apical thrombus 4) Moderate LAE 5) Normal right sided cardiac chambers 6) No pericardial effusion 7) Normal aortic root  Sept. 30, 2016: Doing OK. Still has some limitations.  Does easy yard work , stops to rests  Will check labs today  Using nicotine patches since he stopped smoking   April 07, 2015:  Has had some arthritis of the left shoulder   Last PCI was April , 2016 - DES to the mid LAD  Does not take NTG Wants to have viagra   Past Medical History  Diagnosis Date  . MI (myocardial infarction) [facility])     June 2012 and Apr 2016  . Coronary artery disease DX 2012  . High cholesterol Dx 2012  . Ischemic cardiomyopathy     a. Echo 7/16: EF 35%, inferoseptal and anteroseptal HK, apical anterior, apical inferior and apical lateral HK, grade 1 diastolic dysfunction, trivial MR, normal RV function, PASP 28 mmHg;  b. cMRI  8/16:  Mod LVE, mid and distal septal, apical, distal anterior AK (full thickness scar), EF 46%, no mural clot, mod LAE, normal R sided chambers, no effusion, normal aortic root    Past Surgical History  Procedure Laterality Date  . Left heart catheterization with coronary angiogram N/A 05/03/2014    Procedure: LEFT HEART CATHETERIZATION WITH CORONARY ANGIOGRAM;  Surgeon: Marykay Lex, MD;  Location: [facility] CATH LAB;  Service: Cardiovascular;  Laterality: N/A;  . Percutaneous coronary stent intervention (pci-s)  05/03/2014    Procedure: PERCUTANEOUS CORONARY STENT INTERVENTION (PCI-S);  Surgeon: Marykay Lex, MD;  Location: [facility] CATH LAB;  Service: Cardiovascular;;     Current Outpatient Prescriptions  Medication Sig Dispense Refill  . AMBULATORY NON FORMULARY MEDICATION Take 81 mg by mouth daily. Medication Name: ASA 81 mg daily or PLACEBO (TWILGHT Research study PROVIDED)    . AMBULATORY NON FORMULARY MEDICATION Take 90 mg by mouth 2 (two) times daily. Medication Name: Brilinta 90 mg BID (TWILIGHT Research study provided)    . carvedilol (COREG) 6.25 MG tablet Take 1 tablet (6.25 mg total) by mouth 2 (two) times daily. 60 tablet 11  . diclofenac sodium (VOLTAREN) 1 % GEL Apply 2 g topically 4 (four) times daily. Apply to R elbow 100 g 0  . ibuprofen (ADVIL,MOTRIN) 600 MG tablet Take 1 tablet (600 mg total) by mouth every 8 (eight) hours as needed. 15 tablet 0  . lisinopril (PRINIVIL,ZESTRIL) 2.5 MG tablet Take 1 tablet (2.5  mg total) by mouth daily. 30 tablet 11  . predniSONE (DELTASONE) 10 MG tablet Take 6 tablets (60 mg total) by mouth daily. 30 tablet 0  . sildenafil (VIAGRA) 100 MG tablet Take 0.5-1 tablets (50-100 mg total) by mouth daily as needed for erectile dysfunction. 5 tablet 11  . simvastatin (ZOCOR) 20 MG tablet Take 1 tablet (20 mg total) by mouth every evening. 30 tablet 11   No current facility-administered medications for this visit.    Allergies:   Review of patient's  allergies indicates no known allergies.    Social History:  The patient  reports that he quit smoking about 7 months ago. His smoking use included Cigarettes. He has a 3.7 pack-year smoking history. He has never used smokeless tobacco. He reports that he drinks alcohol. He reports that he does not use illicit drugs.   Family History:  The patient's family history includes Cancer in his father; Emphysema in his mother; Hypertension in his mother. There is no history of Heart attack or Stroke.    ROS:  Please see the history of present illness.    Review of Systems: Constitutional:  denies fever, chills, diaphoresis, appetite change and fatigue.  HEENT: denies photophobia, eye pain, redness, hearing loss, ear pain, congestion, sore throat, rhinorrhea, sneezing, neck pain, neck stiffness and tinnitus.  Respiratory: admits to SOB, DOE,    Cardiovascular: denies chest pain, palpitations and leg swelling.  Gastrointestinal: denies nausea, vomiting, abdominal pain, diarrhea, constipation, blood in stool.  Genitourinary: denies dysuria, urgency, frequency, hematuria, flank pain and difficulty urinating.  Musculoskeletal: denies  myalgias, back pain, joint swelling, arthralgias and gait problem.   Skin: denies pallor, rash and wound.  Neurological: denies dizziness, seizures, syncope, weakness, light-headedness, numbness and headaches.   Hematological: denies adenopathy, easy bruising, personal or family bleeding history.  Psychiatric/ Behavioral: denies suicidal ideation, mood changes, confusion, nervousness, sleep disturbance and agitation.       All other systems are reviewed and negative.    PHYSICAL EXAM: VS:  BP 104/70 mmHg  Pulse 70  Ht  (1.88 m)  Wt 168 lb 6.4 oz (76.386 kg)  BMI 21.61 kg/m2 , BMI Body mass index is 21.61 kg/(m^2). GEN: Well nourished, well developed, in no acute distress HEENT: normal Neck: no JVD, carotid bruits, or masses Cardiac: RRR; no murmurs, rubs,  or gallops,no edema  Respiratory:  clear to auscultation bilaterally, normal work of breathing GI: soft, nontender, nondistended, + BS MS: no deformity or atrophy Skin: warm and dry, no rash Neuro:  Strength and sensation are intact Psych: normal   EKG:  EKG is not ordered today.    Recent Labs: 05/02/2014: B Natriuretic Peptide 54.4 07/08/2014: ALT 31 03/18/2015: BUN 6; Creatinine, Ser 0.83; Hemoglobin 14.0; Platelets 258; Potassium 3.9; Sodium 139    Lipid Panel    Component Value Date/Time   CHOL 124 07/08/2014 0936   TRIG 122.0 07/08/2014 0936   HDL 33.20* 07/08/2014 0936   CHOLHDL 4 07/08/2014 0936   VLDL 24.4 07/08/2014 0936   LDLCALC 66 07/08/2014 0936      Wt Readings from Last 3 Encounters:  04/07/15 168 lb 6.4 oz (76.386 kg)  03/02/15 166 lb (75.297 kg)  11/03/14 167 lb 12.8 oz (76.114 kg)      Other studies Reviewed: Additional studies/ records that were reviewed today include: . Review of the above records demonstrates:    ASSESSMENT AND PLAN:  1.  CAD , status post PCI of his LAD. He's not  having any angina. He is getting Brilinta through a patient assistance program .   Continue current medications. I would suggest that he take Plavix or Brilinta  lifelong. Lavenia Atlas.  Ive suggested that he resume Plavix if he is not able to get the Brilinta    2. Mild systolic congestive heart failure: He had an MRI which revealed an ejection fraction of 46%. Continue Coreg 6.25 g twice a day and lisinopril 2.5 mg a day. We'll titrate up the lisinopril as his blood pressure tolerates.   Current medicines are reviewed at length with the patient today.  The patient does not have concerns regarding medicines.  The following changes have been made:  no change  Labs/ tests ordered today include:  No orders of the defined types were placed in this encounter.     Disposition:   FU with me in  6 months     Janiel Derhammer, Deloris PingPhilip J, MD  04/07/2015 11:07 AM    [facility] [address], [place], [place]  [address] Phone: [phone]; Fax: [phone]   [facility]  [address] [place], [place]  [address] [phone]   Fax [phone]

## 2015-04-11 MED FILL — !VIAGRA 100MG TABLET: 100 | 30 days supply | Qty: 5 | Fill #0

## 2015-04-12 MED FILL — CARVEDILOL 6.25 MG TABLET: 6.25 | 30 days supply | Qty: 60 | Fill #5

## 2015-04-14 ENCOUNTER — Other Ambulatory Visit: Payer: Medicaid Other

## 2015-04-18 ENCOUNTER — Other Ambulatory Visit: Payer: Self-pay | Admitting: *Deleted

## 2015-04-18 DIAGNOSIS — N529 Male erectile dysfunction, unspecified: Secondary | ICD-10-CM

## 2015-04-18 MED ORDER — SILDENAFIL CITRATE 100 MG PO TABS: 50.0000 mg | ORAL_TABLET | Freq: Every day | ORAL | Status: AC | PRN

## 2015-05-03 ENCOUNTER — Telehealth: Payer: Self-pay | Admitting: Family Medicine

## 2015-05-03 DIAGNOSIS — I5022 Chronic systolic (congestive) heart failure: Secondary | ICD-10-CM

## 2015-05-03 DIAGNOSIS — I255 Ischemic cardiomyopathy: Secondary | ICD-10-CM

## 2015-05-03 NOTE — Telephone Encounter (Signed)
Patient is requesting a medication refill for Lisinopril....please send to our pharmacy and call patient.

## 2015-05-04 MED ORDER — LISINOPRIL 2.5 MG PO TABS: 2.5000 mg | ORAL_TABLET | Freq: Every day | ORAL | Status: DC

## 2015-05-04 MED FILL — LISINOPRIL 2.5 MG TABLET: 2.5 | 30 days supply | Qty: 30 | Fill #0

## 2015-05-04 NOTE — Telephone Encounter (Signed)
Lisinopril refilled.

## 2015-06-08 MED FILL — LISINOPRIL 2.5 MG TABLET: 2.5 | 30 days supply | Qty: 30 | Fill #1

## 2015-06-08 MED FILL — ?CARVEDILOL 6.25 MG TABLET: 6.25 | 30 days supply | Qty: 60 | Fill #6

## 2015-08-08 MED FILL — $VIAGRA 100 MG TABLET: 100 | 30 days supply | Qty: 10 | Fill #0

## 2015-08-08 MED FILL — CARVEDILOL 6.25 MG TABLET: 6.25 | 30 days supply | Qty: 60 | Fill #7

## 2015-08-08 MED FILL — LISINOPRIL 2.5 MG TABLET: 2.5 | 30 days supply | Qty: 30 | Fill #2

## 2015-08-10 ENCOUNTER — Telehealth: Payer: Self-pay | Admitting: *Deleted

## 2015-08-10 NOTE — Telephone Encounter (Signed)
Left message for patient to contact research office for end of TWILIGHT research study. I need him to come in before 08/26/15.

## 2015-08-23 ENCOUNTER — Telehealth: Payer: Self-pay | Admitting: Nurse Practitioner

## 2015-08-23 MED ORDER — CLOPIDOGREL BISULFATE 75 MG PO TABS: 75.0000 mg | ORAL_TABLET | Freq: Every day | ORAL | 3 refills | Status: DC

## 2015-08-23 MED FILL — CLOPIDOGREL 75 MG TABLET: 75 | 31 days supply | Qty: 32 | Fill #0

## 2015-08-23 NOTE — Telephone Encounter (Signed)
Rx for clopidogrel 75 mg to be taken daily [facility] with note to take 150 mg on the first day only.  Copy of note routed to Twilight study nurse, Amelia Joammy Hedrick, RN.

## 2015-08-24 ENCOUNTER — Encounter: Payer: Self-pay | Admitting: *Deleted

## 2015-08-24 NOTE — Progress Notes (Signed)
TWILIGHT Research study 15 month (end of treatment) visit completed. Patient denies any bleeding events and states he has been compliant with brilinta and study drug. Informed patient that Plavix script has been called in to community clinic and to start tomorrow 150 mg then 75 mg daily. I gave the patient brilinta for tonight's pm dose. Patient verbalizes understandingand instructed patient to call Cardiology office for any questions regarding medication.

## 2015-08-29 ENCOUNTER — Other Ambulatory Visit: Payer: Self-pay | Admitting: *Deleted

## 2015-09-21 ENCOUNTER — Other Ambulatory Visit: Payer: Self-pay

## 2015-09-21 MED ORDER — SIMVASTATIN 20 MG PO TABS: 20.0000 mg | ORAL_TABLET | Freq: Every evening | ORAL | 2 refills | Status: DC

## 2015-10-06 MED FILL — SIMVASTATIN 20 MG TABLET: 20 | 30 days supply | Qty: 30 | Fill #0

## 2015-10-06 MED FILL — CARVEDILOL 6.25 MG TABLET: 6.25 | 30 days supply | Qty: 60 | Fill #8

## 2015-10-06 MED FILL — LISINOPRIL 2.5 MG TABLET: 2.5 | 30 days supply | Qty: 30 | Fill #3

## 2015-10-06 MED FILL — $VIAGRA 100 MG TABLET: 100 | 30 days supply | Qty: 10 | Fill #1

## 2015-10-06 MED FILL — CLOPIDOGREL 75 MG TABLET: 75 | 31 days supply | Qty: 32 | Fill #1

## 2015-11-06 ENCOUNTER — Telehealth: Payer: Self-pay | Admitting: *Deleted

## 2015-11-06 NOTE — Telephone Encounter (Signed)
Left message for patient to call research office for last telephone follow up (18 month) TWILIGHT research Study.

## 2015-11-07 ENCOUNTER — Encounter: Payer: Self-pay | Admitting: *Deleted

## 2015-11-07 DIAGNOSIS — Z006 Encounter for examination for normal comparison and control in clinical research program: Secondary | ICD-10-CM

## 2015-11-07 NOTE — Progress Notes (Signed)
TWILIGHT Research study 18 month telephone call. Patient denies any adverse or bleeding events. He is still on plavix 75 mg daily with no issues. Informed patient this was the last follow up required for the Research study and thanked him for participating.

## 2015-11-13 MED FILL — LISINOPRIL 2.5 MG TABLET: 2.5 | 30 days supply | Qty: 30 | Fill #4

## 2015-11-13 MED FILL — CLOPIDOGREL 75 MG TABLET: 75 | 31 days supply | Qty: 32 | Fill #2

## 2015-11-15 MED FILL — $VIAGRA 100 MG TABLET: 100 | 30 days supply | Qty: 10 | Fill #2

## 2015-11-15 MED FILL — SIMVASTATIN 20 MG TABLET: 20 | 30 days supply | Qty: 30 | Fill #1

## 2015-11-28 ENCOUNTER — Encounter (INDEPENDENT_AMBULATORY_CARE_PROVIDER_SITE_OTHER): Payer: Self-pay

## 2015-11-28 ENCOUNTER — Ambulatory Visit (INDEPENDENT_AMBULATORY_CARE_PROVIDER_SITE_OTHER): Payer: Self-pay | Admitting: Cardiovascular Disease

## 2015-11-28 ENCOUNTER — Encounter: Payer: Self-pay | Admitting: Cardiovascular Disease

## 2015-11-28 VITALS — BP 130/92 | HR 73 | Ht 74.0 in | Wt 172.8 lb

## 2015-11-28 DIAGNOSIS — I2510 Atherosclerotic heart disease of native coronary artery without angina pectoris: Secondary | ICD-10-CM

## 2015-11-28 DIAGNOSIS — Z9861 Coronary angioplasty status: Secondary | ICD-10-CM

## 2015-11-28 DIAGNOSIS — I5022 Chronic systolic (congestive) heart failure: Secondary | ICD-10-CM

## 2015-11-28 MED ORDER — CARVEDILOL 6.25 MG PO TABS: 6.2500 mg | ORAL_TABLET | Freq: Two times a day (BID) | ORAL | 3 refills | Status: DC

## 2015-11-28 MED ORDER — LISINOPRIL 5 MG PO TABS: 5.0000 mg | ORAL_TABLET | Freq: Every day | ORAL | 3 refills | Status: DC

## 2015-11-28 MED ORDER — SIMVASTATIN 20 MG PO TABS: 20.0000 mg | ORAL_TABLET | Freq: Every evening | ORAL | 3 refills | Status: DC

## 2015-11-28 NOTE — Patient Instructions (Signed)
Your physician has recommended you make the following change in your medication:  1.) increase lisinopril to 5 mg once a day  Your physician recommends that you return for lab work in: 3 weeks (BMET)  Your physician wants you to follow-up in: 6 months with Dr. Elease HashimotoNahser.  You will receive a reminder letter in the mail two months in advance. If you don't receive a letter, please call our office to schedule the follow-up appointment.

## 2015-11-28 NOTE — Progress Notes (Signed)
Cardiology Office Note   Date:  11/28/2015   ID:  Kyle HaggardJames Pittman, DOB 08-03-1955, MRN [id number]  PCP:  Lora PaulaFUNCHES, JOSALYN C, MD  Cardiologist:   Kristeen MissPhilip Nahser, MD   Chief Complaint  Patient presents with  . Congestive Heart Failure    chronic systolic   Problem list 1. Coronary artery disease 2. Chronic systolic congestive heart failure-due to ischemic cardiomyopathy 3. Hyperlipidemia    Kyle HaggardJames Pittman is a 60 y.o. male who presents for follow-up of his coronary artery disease and congestive heart failure. He was seen by Tereso NewcomerScott Weaver, PA several months ago. Lisinopril was added to his medical regimen for his severely depressed left ventricle systolic function.  Repeat echocardiogram revealed persistently depressed left trigger systolic function. A cardiac MRI revealed:  IMPRESSION: 1) Moderate LVE with mid and distal septal, apical and distal anterior wall akinesis. Quantitative EF 46% 2) Full thickness scar involving the above areas with RWMA;s 3) No mural apical thrombus 4) Moderate LAE 5) Normal right sided cardiac chambers 6) No pericardial effusion 7) Normal aortic root  Sept. 30, 2016: Doing OK. Still has some limitations.  Does easy yard work , stops to rests  Will check labs today  Using nicotine patches since he stopped smoking   April 07, 2015:  Has had some arthritis of the left shoulder   Last PCI was April , 2016 - DES to the mid LAD  Does not take NTG Wants to have viagra   Nov. 21, 2017:  Just finished the Brlilinta study.   Switched to Plavix and ASA  No CP  Breathing is good.    Past Medical History:  Diagnosis Date  . Coronary artery disease DX 2012  . High cholesterol Dx 2012  . Ischemic cardiomyopathy    a. Echo 7/16: EF 35%, inferoseptal and anteroseptal HK, apical anterior, apical inferior and apical lateral HK, grade 1 diastolic dysfunction, trivial MR, normal RV function, PASP 28 mmHg;  b. cMRI 8/16:  Mod LVE, mid and distal  septal, apical, distal anterior AK (full thickness scar), EF 46%, no mural clot, mod LAE, normal R sided chambers, no effusion, normal aortic root  . MI (myocardial infarction)    June 2012 and Apr 2016    Past Surgical History:  Procedure Laterality Date  . LEFT HEART CATHETERIZATION WITH CORONARY ANGIOGRAM N/A 05/03/2014   Procedure: LEFT HEART CATHETERIZATION WITH CORONARY ANGIOGRAM;  Surgeon: Marykay Lexavid W Harding, MD;  [facility];  Service: Cardiovascular;  Laterality: N/A;  . PERCUTANEOUS CORONARY STENT INTERVENTION (PCI-S)  05/03/2014   Procedure: PERCUTANEOUS CORONARY STENT INTERVENTION (PCI-S);  Surgeon: Marykay Lexavid W Harding, MD;  [facility];  Service: Cardiovascular;;     Current Outpatient Prescriptions  Medication Sig Dispense Refill  . carvedilol (COREG) 6.25 MG tablet Take 1 tablet (6.25 mg total) by mouth 2 (two) times daily. 60 tablet 11  . clopidogrel (PLAVIX) 75 MG tablet Take 75 mg by mouth daily.  3  . diclofenac sodium (VOLTAREN) 1 % GEL Apply 2 g topically 4 (four) times daily. Apply to R elbow 100 g 0  . ibuprofen (ADVIL,MOTRIN) 600 MG tablet Take 1 tablet (600 mg total) by mouth every 8 (eight) hours as needed. 15 tablet 0  . lisinopril (PRINIVIL,ZESTRIL) 2.5 MG tablet Take 1 tablet (2.5 mg total) by mouth daily. 30 tablet 11  . predniSONE (DELTASONE) 10 MG tablet Take 6 tablets (60 mg total) by mouth daily. 30 tablet 0  . sildenafil (VIAGRA) 100 MG  tablet Take 0.5-1 tablets (50-100 mg total) by mouth daily as needed for erectile dysfunction. 30 tablet 3  . simvastatin (ZOCOR) 20 MG tablet Take 1 tablet (20 mg total) by mouth every evening. 30 tablet 2   No current facility-administered medications for this visit.     Allergies:   Patient has no known allergies.    Social History:  The patient  reports that he quit smoking about 14 months ago. His smoking use included Cigarettes. He has a 3.70 pack-year smoking history. He has never used smokeless  tobacco. He reports that he drinks alcohol. He reports that he does not use drugs.   Family History:  The patient's family history includes Cancer in his father; Emphysema in his mother; Hypertension in his mother.    ROS:  Please see the history of present illness.    Review of Systems: Constitutional:  denies fever, chills, diaphoresis, appetite change and fatigue.  HEENT: denies photophobia, eye pain, redness, hearing loss, ear pain, congestion, sore throat, rhinorrhea, sneezing, neck pain, neck stiffness and tinnitus.  Respiratory: admits to SOB, DOE,    Cardiovascular: denies chest pain, palpitations and leg swelling.  Gastrointestinal: denies nausea, vomiting, abdominal pain, diarrhea, constipation, blood in stool.  Genitourinary: denies dysuria, urgency, frequency, hematuria, flank pain and difficulty urinating.  Musculoskeletal: denies  myalgias, back pain, joint swelling, arthralgias and gait problem.   Skin: denies pallor, rash and wound.  Neurological: denies dizziness, seizures, syncope, weakness, light-headedness, numbness and headaches.   Hematological: denies adenopathy, easy bruising, personal or family bleeding history.  Psychiatric/ Behavioral: denies suicidal ideation, mood changes, confusion, nervousness, sleep disturbance and agitation.       All other systems are reviewed and negative.    PHYSICAL EXAM: VS:  BP (!) 130/92   Pulse 73   Ht 6\' 2"  (1.88 m)   Wt 172 lb 12.8 oz (78.4 kg)   SpO2 98%   BMI 22.19 kg/m  , BMI Body mass index is 22.19 kg/m. GEN: Well nourished, well developed, in no acute distress  HEENT: normal  Neck: no JVD, carotid bruits, or masses Cardiac: RRR; no murmurs, rubs, or gallops,no edema  Respiratory:  clear to auscultation bilaterally, normal work of breathing GI: soft, nontender, nondistended, + BS MS: no deformity or atrophy  Skin: warm and dry, no rash Neuro:  Strength and sensation are intact Psych: normal   EKG:  EKG is  not ordered today.    Recent Labs: 03/18/2015: BUN 6; Creatinine, Ser 0.83; Hemoglobin 14.0; Platelets 258; Potassium 3.9; Sodium 139    Lipid Panel    Component Value Date/Time   CHOL 124 07/08/2014 0936   TRIG 122.0 07/08/2014 0936   HDL 33.20 (L) 07/08/2014 0936   CHOLHDL 4 07/08/2014 0936   VLDL 24.4 07/08/2014 0936   LDLCALC 66 07/08/2014 0936      Wt Readings from Last 3 Encounters:  11/28/15 172 lb 12.8 oz (78.4 kg)  04/07/15 168 lb 6.4 oz (76.4 kg)  03/02/15 166 lb (75.3 kg)      Other studies Reviewed: Additional studies/ records that were reviewed today include: . Review of the above records demonstrates:    ASSESSMENT AND PLAN:  1.  CAD , status post PCI of his LAD. He's not having any angina. He is off the Brilinta and on Plavix and ASA    2. Mild systolic congestive heart failure: He had an MRI which revealed an ejection fraction of 46%.  Tries to stay away from salt .  Continue Coreg 6.25 g twice a day . We'll increase the lisinopril up to 5 mg a day. Check a basic medical profile in 3 weeks. Seen again in 6 months.  Current medicines are reviewed at length with the patient today.  The patient does not have concerns regarding medicines.  The following changes have been made:  no change  Labs/ tests ordered today include:  No orders of the defined types were placed in this encounter.    Disposition:   FU with me in  6 months     Kristeen Miss, MD  11/28/2015 2:36 PM    [facility] [address] [place], [place], [place]  [address] Phone: [phone]; Fax: [phone]   [facility]  [address] [place], [place]  [address] [phone]   Fax [phone]

## 2015-12-12 MED FILL — CLOPIDOGREL 75 MG TABLET: 75 | 31 days supply | Qty: 32 | Fill #3

## 2015-12-12 MED FILL — LISINOPRIL 2.5 MG TABLET: 2.5 | 30 days supply | Qty: 30 | Fill #5

## 2015-12-29 ENCOUNTER — Telehealth: Payer: Self-pay | Admitting: Nurse Practitioner

## 2015-12-29 NOTE — Telephone Encounter (Signed)
Called patient to schedule lab appointment for bmet due to recent increase in lisinopril. I scheduled his lab appointment for 12/28 and he is aware to arrive between 7:30 am and 4:30 pm that day for bmet.  Patient states he did not get the 5 mg l[facility]ril tabs [facility] and he is about to run out of the 2.5 mg tabs because he has been taking 2 of them daily. I advised him that I will call the pharmacy to check on his Rx. I called and spoke with the pharmacy tech and she advised patient never came to pick up his Rx after Dr. Elease HashimotoNahser increased dose. She states she will fill the new Rx for 5 mg tabs and patient can pick up after 12:30 today. I left a voice mail message for patient to pick up the Rx today before 4 pm because they are closing early.

## 2016-01-04 ENCOUNTER — Other Ambulatory Visit: Payer: Self-pay

## 2016-01-10 MED FILL — ?CARVEDILOL 6.25 MG TABLET: 6.25 | 30 days supply | Qty: 60 | Fill #0

## 2016-01-10 MED FILL — CLOPIDOGREL 75 MG TABLET: 75 | 31 days supply | Qty: 32 | Fill #4

## 2016-01-10 MED FILL — LISINOPRIL 5 MG TABLET: 5 | 30 days supply | Qty: 30 | Fill #0

## 2016-01-10 MED FILL — $VIAGRA 100 MG TABLET: 100 | 30 days supply | Qty: 10 | Fill #3

## 2016-02-22 MED FILL — LISINOPRIL 5 MG TABLET: 5 | 30 days supply | Qty: 30 | Fill #1

## 2016-02-22 MED FILL — $VIAGRA 100 MG TABLET: 100 | 30 days supply | Qty: 10 | Fill #4

## 2016-02-22 MED FILL — CLOPIDOGREL 75 MG TABLET: 75 | 31 days supply | Qty: 32 | Fill #5

## 2016-02-22 MED FILL — ?CARVEDILOL 6.25 MG TABLET: 6.25 | 30 days supply | Qty: 60 | Fill #1

## 2016-03-27 MED FILL — ?LISINOPRIL 5 MG TABLET: 5 | 30 days supply | Qty: 30 | Fill #2

## 2016-03-27 MED FILL — CLOPIDOGREL 75 MG TABLET: 75 | 31 days supply | Qty: 32 | Fill #6

## 2016-03-27 MED FILL — $VIAGRA 100 MG TABLET: 100 | 30 days supply | Qty: 10 | Fill #5

## 2016-05-22 ENCOUNTER — Encounter: Payer: Self-pay | Admitting: Family Medicine

## 2016-06-03 ENCOUNTER — Emergency Department (HOSPITAL_COMMUNITY)

## 2016-06-03 ENCOUNTER — Observation Stay (HOSPITAL_COMMUNITY)

## 2016-06-03 ENCOUNTER — Encounter (HOSPITAL_COMMUNITY): Payer: Self-pay | Admitting: Emergency Medicine

## 2016-06-03 ENCOUNTER — Inpatient Hospital Stay (HOSPITAL_COMMUNITY)
Admission: EM | Admit: 2016-06-03 | Discharge: 2016-06-05 | DRG: 065 | Attending: Internal Medicine | Admitting: Internal Medicine

## 2016-06-03 DIAGNOSIS — I2510 Atherosclerotic heart disease of native coronary artery without angina pectoris: Secondary | ICD-10-CM

## 2016-06-03 DIAGNOSIS — R2990 Unspecified symptoms and signs involving the nervous system: Secondary | ICD-10-CM | POA: Diagnosis present

## 2016-06-03 DIAGNOSIS — I6340 Cerebral infarction due to embolism of unspecified cerebral artery: Secondary | ICD-10-CM | POA: Diagnosis not present

## 2016-06-03 DIAGNOSIS — I5022 Chronic systolic (congestive) heart failure: Secondary | ICD-10-CM | POA: Diagnosis present

## 2016-06-03 DIAGNOSIS — I214 Non-ST elevation (NSTEMI) myocardial infarction: Secondary | ICD-10-CM | POA: Diagnosis present

## 2016-06-03 DIAGNOSIS — Z955 Presence of coronary angioplasty implant and graft: Secondary | ICD-10-CM

## 2016-06-03 DIAGNOSIS — Z79899 Other long term (current) drug therapy: Secondary | ICD-10-CM

## 2016-06-03 DIAGNOSIS — I110 Hypertensive heart disease with heart failure: Secondary | ICD-10-CM | POA: Diagnosis present

## 2016-06-03 DIAGNOSIS — G8321 Monoplegia of upper limb affecting right dominant side: Secondary | ICD-10-CM | POA: Diagnosis present

## 2016-06-03 DIAGNOSIS — I639 Cerebral infarction, unspecified: Secondary | ICD-10-CM | POA: Diagnosis present

## 2016-06-03 DIAGNOSIS — R471 Dysarthria and anarthria: Secondary | ICD-10-CM | POA: Diagnosis present

## 2016-06-03 DIAGNOSIS — E7849 Other hyperlipidemia: Secondary | ICD-10-CM

## 2016-06-03 DIAGNOSIS — I252 Old myocardial infarction: Secondary | ICD-10-CM

## 2016-06-03 DIAGNOSIS — Z8673 Personal history of transient ischemic attack (TIA), and cerebral infarction without residual deficits: Secondary | ICD-10-CM

## 2016-06-03 DIAGNOSIS — R29810 Facial weakness: Secondary | ICD-10-CM | POA: Diagnosis present

## 2016-06-03 DIAGNOSIS — Z9861 Coronary angioplasty status: Secondary | ICD-10-CM

## 2016-06-03 DIAGNOSIS — Z7902 Long term (current) use of antithrombotics/antiplatelets: Secondary | ICD-10-CM

## 2016-06-03 DIAGNOSIS — I255 Ischemic cardiomyopathy: Secondary | ICD-10-CM | POA: Diagnosis present

## 2016-06-03 DIAGNOSIS — Z87891 Personal history of nicotine dependence: Secondary | ICD-10-CM

## 2016-06-03 DIAGNOSIS — E785 Hyperlipidemia, unspecified: Secondary | ICD-10-CM | POA: Diagnosis present

## 2016-06-03 DIAGNOSIS — E784 Other hyperlipidemia: Secondary | ICD-10-CM | POA: Diagnosis present

## 2016-06-03 LAB — DIFFERENTIAL
Basophils Absolute: 0 10*3/uL (ref 0.0–0.1)
Basophils Relative: 1 %
Eosinophils Absolute: 0.2 10*3/uL (ref 0.0–0.7)
Eosinophils Relative: 4 %
LYMPHS PCT: 53 %
Lymphs Abs: 2.9 10*3/uL (ref 0.7–4.0)
MONO ABS: 0.5 10*3/uL (ref 0.1–1.0)
MONOS PCT: 8 %
NEUTROS ABS: 1.9 10*3/uL (ref 1.7–7.7)
Neutrophils Relative %: 34 %

## 2016-06-03 LAB — RAPID URINE DRUG SCREEN, HOSP PERFORMED
AMPHETAMINES: NOT DETECTED
BARBITURATES: NOT DETECTED
Benzodiazepines: NOT DETECTED
Cocaine: NOT DETECTED
Opiates: NOT DETECTED
Tetrahydrocannabinol: NOT DETECTED

## 2016-06-03 LAB — APTT: aPTT: 31 seconds (ref 24–36)

## 2016-06-03 LAB — COMPREHENSIVE METABOLIC PANEL
ALK PHOS: 58 U/L (ref 38–126)
ALT: 27 U/L (ref 17–63)
AST: 20 U/L (ref 15–41)
Albumin: 3.9 g/dL (ref 3.5–5.0)
Anion gap: 6 (ref 5–15)
BUN: 5 mg/dL — AB (ref 6–20)
CALCIUM: 9.1 mg/dL (ref 8.9–10.3)
CO2: 27 mmol/L (ref 22–32)
CREATININE: 0.83 mg/dL (ref 0.61–1.24)
Chloride: 105 mmol/L (ref 101–111)
Glucose, Bld: 111 mg/dL — ABNORMAL HIGH (ref 65–99)
Potassium: 4 mmol/L (ref 3.5–5.1)
Sodium: 138 mmol/L (ref 135–145)
Total Bilirubin: 0.6 mg/dL (ref 0.3–1.2)
Total Protein: 6.8 g/dL (ref 6.5–8.1)

## 2016-06-03 LAB — URINALYSIS, ROUTINE W REFLEX MICROSCOPIC
BILIRUBIN URINE: NEGATIVE
Glucose, UA: NEGATIVE mg/dL
Hgb urine dipstick: NEGATIVE
Ketones, ur: NEGATIVE mg/dL
Leukocytes, UA: NEGATIVE
NITRITE: NEGATIVE
PROTEIN: NEGATIVE mg/dL
SPECIFIC GRAVITY, URINE: 1.008 (ref 1.005–1.030)
pH: 5 (ref 5.0–8.0)

## 2016-06-03 LAB — I-STAT TROPONIN, ED: TROPONIN I, POC: 0 ng/mL (ref 0.00–0.08)

## 2016-06-03 LAB — PROTIME-INR
INR: 0.94
Prothrombin Time: 12.5 seconds (ref 11.4–15.2)

## 2016-06-03 LAB — CBG MONITORING, ED: GLUCOSE-CAPILLARY: 108 mg/dL — AB (ref 65–99)

## 2016-06-03 LAB — ETHANOL

## 2016-06-03 LAB — CBC
HEMATOCRIT: 44.7 % (ref 39.0–52.0)
HEMOGLOBIN: 15 g/dL (ref 13.0–17.0)
MCH: 28.5 pg (ref 26.0–34.0)
MCHC: 33.6 g/dL (ref 30.0–36.0)
MCV: 85 fL (ref 78.0–100.0)
Platelets: 223 10*3/uL (ref 150–400)
RBC: 5.26 MIL/uL (ref 4.22–5.81)
RDW: 13.3 % (ref 11.5–15.5)
WBC: 5.5 10*3/uL (ref 4.0–10.5)

## 2016-06-03 LAB — TROPONIN I: Troponin I: <0.03 ng/mL (ref <0.03)

## 2016-06-03 MED ORDER — GADOBENATE DIMEGLUMINE 529 MG/ML IV SOLN
20.0000 mL | Freq: Once | INTRAVENOUS | Status: AC
  Administered 2016-06-03: 18 mL via INTRAVENOUS

## 2016-06-03 MED ORDER — CARVEDILOL 12.5 MG PO TABS
6.2500 mg | ORAL_TABLET | Freq: Two times a day (BID) | ORAL | Status: DC
  Administered 2016-06-03 – 2016-06-05 (×4): 6.25 mg via ORAL
  Filled 2016-06-03 (×4): qty 1

## 2016-06-03 MED ORDER — ACETAMINOPHEN 325 MG PO TABS: 650.0000 mg | ORAL_TABLET | ORAL | Status: DC | PRN

## 2016-06-03 MED ORDER — ACETAMINOPHEN 160 MG/5ML PO SOLN: 650.0000 mg | ORAL | Status: DC | PRN

## 2016-06-03 MED ORDER — ACETAMINOPHEN 650 MG RE SUPP: 650.0000 mg | RECTAL | Status: DC | PRN

## 2016-06-03 MED ORDER — SIMVASTATIN 20 MG PO TABS
20.0000 mg | ORAL_TABLET | Freq: Every evening | ORAL | Status: DC
  Administered 2016-06-03 – 2016-06-04 (×2): 20 mg via ORAL
  Filled 2016-06-03 (×2): qty 1

## 2016-06-03 MED ORDER — CLOPIDOGREL BISULFATE 75 MG PO TABS
75.0000 mg | ORAL_TABLET | Freq: Every day | ORAL | Status: DC
  Administered 2016-06-03 – 2016-06-04 (×2): 75 mg via ORAL
  Filled 2016-06-03 (×2): qty 1

## 2016-06-03 MED ORDER — ENOXAPARIN SODIUM 40 MG/0.4ML ~~LOC~~ SOLN
40.0000 mg | SUBCUTANEOUS | Status: DC
  Administered 2016-06-03 – 2016-06-04 (×2): 40 mg via SUBCUTANEOUS
  Filled 2016-06-03 (×2): qty 0.4

## 2016-06-03 MED ORDER — STROKE: EARLY STAGES OF RECOVERY BOOK
Freq: Once | Status: AC
  Administered 2016-06-03: 20:00:00
  Filled 2016-06-03: qty 1

## 2016-06-03 NOTE — ED Provider Notes (Signed)
[facility] Provider Note   CSN: [id number] Arrival date & time: 06/03/16  [id number]     History   Chief Complaint Chief Complaint  Patient presents with  . Numbness    Slurred speech - noticed on awakening, LSN 1 am    HPI Star AgeJames XXXFuller is a 61 y.o. male.  HPI  61 year old African American male with history of MI presenting today with slurred speech and right arm weakness. Patient on Plavix. Patient went to bed at 1 AM. He woke up this morning and noted that he had funny speech. He also noted weakness in his R hand and numbness to his right hand. He also noticed some blurred vision. Although he now states he is unsure whether he had actually blured vision or it was because he is not used to being outside because incarcerated.    Past Medical History:  Diagnosis Date  . Coronary artery disease DX 2012  . High cholesterol Dx 2012  . Ischemic cardiomyopathy    a. Echo 7/16: EF 35%, inferoseptal and anteroseptal HK, apical anterior, apical inferior and apical lateral HK, grade 1 diastolic dysfunction, trivial MR, normal RV function, PASP 28 mmHg;  b. cMRI 8/16:  Mod LVE, mid and distal septal, apical, distal anterior AK (full thickness scar), EF 46%, no mural clot, mod LAE, normal R sided chambers, no effusion, normal aortic root  . MI (myocardial infarction) [facility])    June 2012 and Apr 2016    Patient Active Problem List   Diagnosis Date Noted  . Erectile dysfunction 03/02/2015  . Former smoker 03/02/2015  . Chronic systolic CHF (congestive heart failure) (HCC) 10/07/2014  . Non-STEMI (non-ST elevated myocardial infarction) (HCC)   . CAD S/P percutaneous coronary angioplasty     Past Surgical History:  Procedure Laterality Date  . LEFT HEART CATHETERIZATION WITH CORONARY ANGIOGRAM N/A 05/03/2014   Procedure: LEFT HEART CATHETERIZATION WITH CORONARY ANGIOGRAM;  Surgeon: Marykay Lexavid W Harding, MD;  [facility];  Service: Cardiovascular;  Laterality: N/A;  .  PERCUTANEOUS CORONARY STENT INTERVENTION (PCI-S)  05/03/2014   Procedure: PERCUTANEOUS CORONARY STENT INTERVENTION (PCI-S);  Surgeon: Marykay Lexavid W Harding, MD;  [facility];  Service: Cardiovascular;;       Home Medications    Prior to Admission medications   Medication Sig Start Date End Date Taking? Authorizing Provider  carvedilol (COREG) 6.25 MG tablet Take 1 tablet (6.25 mg total) by mouth 2 (two) times daily. 11/28/15   Nahser, Deloris PingPhilip J, MD  clopidogrel (PLAVIX) 75 MG tablet Take 75 mg by mouth daily. 11/13/15   [provider]  diclofenac sodium (VOLTAREN) 1 % GEL Apply 2 g topically 4 (four) times daily. Apply to R elbow 07/22/14   Dessa PhiFunches, Josalyn, MD  ibuprofen (ADVIL,MOTRIN) 600 MG tablet Take 1 tablet (600 mg total) by mouth every 8 (eight) hours as needed. 03/18/15   Azalia Bilisampos, Kevin, MD  lisinopril (PRINIVIL,ZESTRIL) 5 MG tablet Take 1 tablet (5 mg total) by mouth daily. 11/28/15 02/26/16  Nahser, Deloris PingPhilip J, MD  predniSONE (DELTASONE) 10 MG tablet Take 6 tablets (60 mg total) by mouth daily. 03/18/15   Azalia Bilisampos, Kevin, MD  sildenafil (VIAGRA) 100 MG tablet Take 0.5-1 tablets (50-100 mg total) by mouth daily as needed for erectile dysfunction. 04/18/15   Funches, Gerilyn NestleJosalyn, MD  simvastatin (ZOCOR) 20 MG tablet Take 1 tablet (20 mg total) by mouth every evening. 11/28/15   Nahser, Deloris PingPhilip J, MD    Family History Family History  Problem Relation Age  of Onset  . Emphysema Mother   . Hypertension Mother   . Cancer Father   . Heart attack Neg Hx   . Stroke Neg Hx     Social History Social History  Substance Use Topics  . Smoking status: Former Smoker    Packs/day: 0.10    Years: 37.00    Types: Cigarettes    Quit date: 09/01/2014  . Smokeless tobacco: Never Used  . Alcohol use 0.0 oz/week     Comment: occ     Allergies   Patient has no known allergies.   Review of Systems Review of Systems  Constitutional: Negative for activity change.  Respiratory: Negative  for shortness of breath.   Cardiovascular: Negative for chest pain.  Gastrointestinal: Negative for abdominal pain.  Neurological: Positive for speech difficulty, weakness and numbness.  All other systems reviewed and are negative.    Physical Exam Updated Vital Signs BP (!) 159/104   Pulse 73   Temp 98.2 F (36.8 C) (Oral)   Resp (!) 22   SpO2 100%   Physical Exam  Constitutional: He is oriented to person, place, and time. He appears well-nourished.  HENT:  Head: Normocephalic.  Eyes: Conjunctivae are normal.  Cardiovascular: Normal rate and regular rhythm.   No murmur heard. Pulmonary/Chest: Effort normal and breath sounds normal. No respiratory distress.  Abdominal: Soft. There is no tenderness.  Musculoskeletal: Normal range of motion.  Neurological: He is oriented to person, place, and time. Coordination normal.  Equal strength bilaterally upper and lower extremities negative pronator drift. Normal sensation bilaterally. . Facial nerve tested and appears grossly normal. Alert and oriented 3.  Speech slurred intermittently,  Skin: Skin is warm and dry. He is not diaphoretic.  Psychiatric: He has a normal mood and affect. His behavior is normal.     ED Treatments / Results  Labs (all labs ordered are listed, but only abnormal results are displayed) Labs Reviewed  COMPREHENSIVE METABOLIC PANEL - Abnormal; Notable for the following:       Result Value   Glucose, Bld 111 (*)    BUN 5 (*)    All other components within normal limits  CBG MONITORING, ED - Abnormal; Notable for the following:    Glucose-Capillary 108 (*)    All other components within normal limits  ETHANOL  PROTIME-INR  APTT  CBC  DIFFERENTIAL  URINALYSIS, ROUTINE W REFLEX MICROSCOPIC  RAPID URINE DRUG SCREEN, HOSP PERFORMED  I-STAT TROPOININ, ED    EKG  EKG Interpretation  Date/Time:  Monday Jun 03 2016 09:50:36 EDT Ventricular Rate:  80 PR Interval:    QRS Duration: 83 QT  Interval:  328 QTC Calculation: 379 R Axis:   86 Text Interpretation:  Sinus rhythm Probable anterior infarct, age indeterminate No significant change since last tracing Confirmed by Bary Castilla ([id number]) on 06/03/2016 9:57:15 AM Also confirmed by Corlis Leak, Courteney ([id number]), editor Rollen Sox [phone])  on 06/03/2016 10:25:06 AM       Radiology Ct Head Wo Contrast  Result Date: 06/03/2016 CLINICAL DATA:  Right-sided facial droop and slurred speech EXAM: CT HEAD WITHOUT CONTRAST TECHNIQUE: Contiguous axial images were obtained from the base of the skull through the vertex without intravenous contrast. COMPARISON:  None. FINDINGS: Brain: Small lacunar infarct is noted in the basal ganglia on the left laterally. No findings to suggest acute hemorrhage, acute infarction or space-occupying mass lesion are noted. Vascular: No hyperdense vessel or unexpected calcification. Skull: Normal. Negative for fracture or focal lesion. Sinuses/Orbits:  No acute finding. Other: None. 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 No acute abnormality is noted. Electronically Signed   By: Alcide Clever M.D.   On: 06/03/2016 10:34    Procedures Procedures (including critical care time)  Medications Ordered in ED Medications - No data to display   Initial Impression / Assessment and Plan / ED Course  I have reviewed the triage vital signs and the nursing notes.  Pertinent labs & imaging results that were available during my care of the patient were reviewed by me and considered in my medical decision making (see chart for details).    Patient is a 61 year old incarcerated male with past medical history significant for hypertension hyperlipidemia and MI, on Plavix presenting today with strokelike symptoms. Patient numbness in his hand has resolved. As well as visual changes. Patient still has slurred speech. Although its intermittent and difficult to assess. Otherwise neurologically  intact.  Will discuss with neurology. Main question being whether we can MRI discharge home.  11:51 AM Patient with residual slurred speech. Seen by neurology, Dr. Amada Jupiter. Will admit for stroke work up.   Final Clinical Impressions(s) / ED Diagnoses   Final diagnoses:  None    New Prescriptions New Prescriptions   No medications on file     Abelino Derrick, MD 06/03/16 1151

## 2016-06-03 NOTE — Consult Note (Addendum)
Neurology Consultation Reason for Consult: Right-sided weakness Referring Physician: Corlis Leak, C  CC: Right-sided weakness  History is obtained from: Patient  HPI: Kyle Pittman is a 61 y.o. male who was last in his normal health when he went to bed last night. He awoke this morning and noticed that his right side felt weaker than typical. He was also slurring his speech. He also notes some right-sided numbness. He states the numbness has improved some, but his speech still does not sound normal to him.   LKW: 5/27 prior to bed tpa given?: no, out of window    ROS: A 14 point ROS was performed and is negative except as noted in the HPI.   Past Medical History:  Diagnosis Date  . Coronary artery disease DX 2012  . High cholesterol Dx 2012  . Ischemic cardiomyopathy    a. Echo 7/16: EF 35%, inferoseptal and anteroseptal HK, apical anterior, apical inferior and apical lateral HK, grade 1 diastolic dysfunction, trivial MR, normal RV function, PASP 28 mmHg;  b. cMRI 8/16:  Mod LVE, mid and distal septal, apical, distal anterior AK (full thickness scar), EF 46%, no mural clot, mod LAE, normal R sided chambers, no effusion, normal aortic root  . MI (myocardial infarction) [facility])    June 2012 and Apr 2016     Family History  Problem Relation Age of Onset  . Emphysema Mother   . Hypertension Mother   . Cancer Father   . Heart attack Neg Hx   . Stroke Neg Hx      Social History:  reports that he quit smoking about 21 months ago. His smoking use included Cigarettes. He has a 3.70 pack-year smoking history. He has never used smokeless tobacco. He reports that he drinks alcohol. He reports that he does not use drugs.   Exam: Current vital signs: BP (!) 156/113   Pulse 75   Temp 98.1 F (36.7 C)   Resp 16   SpO2 100%  Vital signs in last 24 hours: Temp:  [98.1 F (36.7 C)-98.2 F (36.8 C)] 98.1 F (36.7 C) (05/28 1238) Pulse Rate:  [68-79] 75 (05/28 1230) Resp:  [16-23] 16  (05/28 1230) BP: (137-159)/(101-113) 156/113 (05/28 1230) SpO2:  [98 %-100 %] 100 % (05/28 1230)   Physical Exam  Constitutional: Appears well-developed and well-nourished.  Psych: Affect appropriate to situation Eyes: No scleral injection HENT: No OP obstrucion Head: Normocephalic.  Cardiovascular: Normal rate and regular rhythm.  Respiratory: Effort normal and breath sounds normal to anterior ascultation GI: Soft.  No distension. There is no tenderness.  Skin: WDI  Neuro: Mental Status: Patient is awake, alert, oriented to person, place, month, year, and situation. Patient is able to give a clear and coherent history. No signs of aphasia or neglect Cranial Nerves: II: Visual Fields are full. Pupils are equal, round, and reactive to light.   III,IV, VI: EOMI without ptosis or diploplia.  V: Facial sensation is symmetric to temperature VII: Facial movement with right-sided weakness VIII: hearing is intact to voice X: Uvula elevates symmetrically XI: Shoulder shrug is symmetric. XII: tongue is midline without atrophy or fasciculations.  Motor: Tone is normal. Bulk is normal. 5/5 strength was present on the left, on the right he has 4+/5 weakness of the arm and leg Sensory: Sensation is mildly diminished in the right Cerebellar: FNF intact bilaterally      I have reviewed labs in epic and the results pertinent to this consultation are: CMP-unremarkable  I  have reviewed the images obtained: CT head-previous left basal ganglia infarct  Impression: 61 year old male with right-sided weakness since awakening this morning, reports some improvement. I strongly suspect that he has a small subcortical infarct and he will need to be admitted for secondary risk factor modification.  Recommendations: 1. HgbA1c, fasting lipid panel 2. MRI, MRA  of the brain without contrast 3. Frequent neuro checks 4. Echocardiogram 5. Carotid dopplers 6. Prophylactic therapy-Antiplatelet med:  Aspirin - dose 325mg  PO or 300mg  PR, can restart Plavix instead if he passes swallow screen. 7. Risk factor modification 8. Telemetry monitoring 9. PT consult, OT consult, Speech consult 10. please page stroke NP  Or  PA  Or MD  from 8am -4 pm as this patient will be followed by the stroke team at this point.   You can look them up on [email or web address]    Ritta SlotMcNeill Davidlee Jeanbaptiste, MD [facility] [phone]  If 7pm- 7am, please page neurology on call as listed in AMION.

## 2016-06-03 NOTE — ED Notes (Signed)
Patient transported to MRI 

## 2016-06-03 NOTE — ED Triage Notes (Signed)
Pt to ER for evaluation of slurred speech and right arm weakness noticed on awakening this morning. Pt reports went to bed last night approximately 1 am normal. Reports blurred vision as well. No drift noted on exam but grip strength to right hand is less than left. Slurred speech noted with difficulty controlling secretions. Pt is a/o x4.

## 2016-06-03 NOTE — ED Notes (Signed)
Pt CBG was 108, notified Hayley(RN)

## 2016-06-03 NOTE — H&P (Signed)
History and Physical    Kyle Pittman ONG:[id number] DOB: 17-Apr-1955 DOA: 06/03/2016   PCP: Dessa PhiFunches, Josalyn, MD   Patient coming from:  Home    Chief Complaint: Slurred speech   HPI: Kyle Pittman is a 61 y.o. male with medical history significant for CAD s/p MI 2016 with stent placement, ICM, presenting with acute onset of L facial numbness, right facial droop, dysarthria, and R upper extremity weakness. Symptoms woke patient up from sleep and improved at the ED. Patient  never had a similar episode. Denies any history of TIA. Denies vertigo dizziness or vision changes. Denies headaches, confusion or seizures. Denies any chest pain, or shortness of breath. Denies any fever or chills, or night sweats. No tobacco. No new meds or hormonal supplements. Does take a regular ASA and Plavix. Patient is compliant with his medications. Has decreased activity in view of incarceration over the last 29 days.Marland Kitchen. He is not a diabetic.  family history of stroke in his brother Will admit for further evaluation and treatment.    ED Course:  BP (!) 140/108   Pulse 74   Temp 98.1 F (36.7 C)   Resp 20   SpO2 100%   CT head neg for acute abnormalities  ETOH less than 5  UA pending Tn 0 UDS neg  EKG SR without ACS    Review of Systems:  As per HPI otherwise all other systems reviewed and are negative  Past Medical History:  Diagnosis Date  . Coronary artery disease DX 2012  . High cholesterol Dx 2012  . Ischemic cardiomyopathy    a. Echo 7/16: EF 35%, inferoseptal and anteroseptal HK, apical anterior, apical inferior and apical lateral HK, grade 1 diastolic dysfunction, trivial MR, normal RV function, PASP 28 mmHg;  b. cMRI 8/16:  Mod LVE, mid and distal septal, apical, distal anterior AK (full thickness scar), EF 46%, no mural clot, mod LAE, normal R sided chambers, no effusion, normal aortic root  . MI (myocardial infarction) [facility](HCC)    June 2012 and Apr 2016    Past Surgical History:    Procedure Laterality Date  . LEFT HEART CATHETERIZATION WITH CORONARY ANGIOGRAM N/A 05/03/2014   Procedure: LEFT HEART CATHETERIZATION WITH CORONARY ANGIOGRAM;  Surgeon: Marykay Lexavid W Harding, MD;  [facility];  Service: Cardiovascular;  Laterality: N/A;  . PERCUTANEOUS CORONARY STENT INTERVENTION (PCI-S)  05/03/2014   Procedure: PERCUTANEOUS CORONARY STENT INTERVENTION (PCI-S);  Surgeon: Marykay Lexavid W Harding, MD;  [facility];  Service: Cardiovascular;;    Social History Social History   Social History  . Marital status: Single    Spouse name: N/A  . Number of children: N/A  . Years of education: N/A   Occupational History  . Not on file.   Social History Main Topics  . Smoking status: Former Smoker    Packs/day: 0.10    Years: 37.00    Types: Cigarettes    Quit date: 09/01/2014  . Smokeless tobacco: Never Used  . Alcohol use 0.0 oz/week     Comment: occ  . Drug use: No  . Sexual activity: Not on file   Other Topics Concern  . Not on file   Social History Narrative  . No narrative on file     No Known Allergies  Family History  Problem Relation Age of Onset  . Emphysema Mother   . Hypertension Mother   . Cancer Father   . Heart attack Neg Hx   . Stroke Neg Hx  Prior to Admission medications   Medication Sig Start Date End Date Taking? Authorizing Provider  carvedilol (COREG) 6.25 MG tablet Take 1 tablet (6.25 mg total) by mouth 2 (two) times daily. 11/28/15   Nahser, Deloris Ping, MD  clopidogrel (PLAVIX) 75 MG tablet Take 75 mg by mouth daily. 11/13/15   [provider]  diclofenac sodium (VOLTAREN) 1 % GEL Apply 2 g topically 4 (four) times daily. Apply to R elbow 07/22/14   Dessa Phi, MD  ibuprofen (ADVIL,MOTRIN) 600 MG tablet Take 1 tablet (600 mg total) by mouth every 8 (eight) hours as needed. 03/18/15   Azalia Bilis, MD  lisinopril (PRINIVIL,ZESTRIL) 5 MG tablet Take 1 tablet (5 mg total) by mouth daily. 11/28/15 02/26/16  Nahser,  Deloris Ping, MD  predniSONE (DELTASONE) 10 MG tablet Take 6 tablets (60 mg total) by mouth daily. 03/18/15   Azalia Bilis, MD  sildenafil (VIAGRA) 100 MG tablet Take 0.5-1 tablets (50-100 mg total) by mouth daily as needed for erectile dysfunction. 04/18/15   Funches, Gerilyn Nestle, MD  simvastatin (ZOCOR) 20 MG tablet Take 1 tablet (20 mg total) by mouth every evening. 11/28/15   Nahser, Deloris Ping, MD    Physical Exam:  Vitals:   06/03/16 1200 06/03/16 1230 06/03/16 1238 06/03/16 1300  BP: (!) 142/107 (!) 156/113  (!) 140/108  Pulse: 73 75  74  Resp: (!) 23 16  20   Temp:   98.1 F (36.7 C)   TempSrc:      SpO2: 100% 100%  100%   Constitutional: NAD, calm, comfortable   Eyes: PERRL, lids and conjunctivae normal ENMT: Mucous membranes are moist, without exudate or lesions  Neck: normal, supple, no masses, no thyromegaly Respiratory: clear to auscultation bilaterally, no wheezing, no crackles. Normal respiratory effort  Cardiovascular: Regular rate and rhythm, no murmurs, rubs or gallops. No extremity edema. 2+ pedal pulses. No carotid bruits.  Abdomen: Soft, non tender, No hepatosplenomegaly. Bowel sounds positive.  Musculoskeletal: no clubbing / cyanosis. Moves all extremities Skin: no jaundice, No lesions.  Neurologic: Sensation intact  Strength equal in  3  Extremities, minimal residual weakness in the RUE  Psychiatric:   Alert and oriented x 3. Normal mood.     Labs on Admission: I have personally reviewed following labs and imaging studies  CBC:  Recent Labs Lab 06/03/16 1016  WBC 5.5  NEUTROABS 1.9  HGB 15.0  HCT 44.7  MCV 85.0  PLT 223    Basic Metabolic Panel:  Recent Labs Lab 06/03/16 1016  NA 138  K 4.0  CL 105  CO2 27  GLUCOSE 111*  BUN 5*  CREATININE 0.83  CALCIUM 9.1    GFR: CrCl cannot be calculated (Unknown ideal weight.).  Liver Function Tests:  Recent Labs Lab 06/03/16 1016  AST 20  ALT 27  ALKPHOS 58  BILITOT 0.6  PROT 6.8  ALBUMIN 3.9     No results for input(s): LIPASE, AMYLASE in the last 168 hours. No results for input(s): AMMONIA in the last 168 hours.  Coagulation Profile:  Recent Labs Lab 06/03/16 1016  INR 0.94    Cardiac Enzymes:  Recent Labs Lab 06/03/16 1228  TROPONINI <0.03    BNP (last 3 results) No results for input(s): PROBNP in the last 8760 hours.  HbA1C: No results for input(s): HGBA1C in the last 72 hours.  CBG:  Recent Labs Lab 06/03/16 1000  GLUCAP 108*    Lipid Profile: No results for input(s): CHOL, HDL, LDLCALC, TRIG, CHOLHDL,  LDLDIRECT in the last 72 hours.  Thyroid Function Tests: No results for input(s): TSH, T4TOTAL, FREET4, T3FREE, THYROIDAB in the last 72 hours.  Anemia Panel: No results for input(s): VITAMINB12, FOLATE, FERRITIN, TIBC, IRON, RETICCTPCT in the last 72 hours.  Urine analysis:    Component Value Date/Time   COLORURINE YELLOW 06/03/2016 1118   APPEARANCEUR CLEAR 06/03/2016 1118   LABSPEC 1.008 06/03/2016 1118   PHURINE 5.0 06/03/2016 1118   GLUCOSEU NEGATIVE 06/03/2016 1118   HGBUR NEGATIVE 06/03/2016 1118   BILIRUBINUR NEGATIVE 06/03/2016 1118   KETONESUR NEGATIVE 06/03/2016 1118   PROTEINUR NEGATIVE 06/03/2016 1118   NITRITE NEGATIVE 06/03/2016 1118   LEUKOCYTESUR NEGATIVE 06/03/2016 1118    Sepsis Labs: @LABRCNTIP (procalcitonin:4,lacticidven:4) )No results found for this or any previous visit (from the past 240 hour(s)).   Radiological Exams on Admission: Ct Head Wo Contrast  Result Date: 06/03/2016 CLINICAL DATA:  Right-sided facial droop and slurred speech EXAM: CT HEAD WITHOUT CONTRAST TECHNIQUE: Contiguous axial images were obtained from the base of the skull through the vertex without intravenous contrast. COMPARISON:  None. FINDINGS: Brain: Small lacunar infarct is noted in the basal ganglia on the left laterally. No findings to suggest acute hemorrhage, acute infarction or space-occupying mass lesion are noted. Vascular: No  hyperdense vessel or unexpected calcification. Skull: Normal. Negative for fracture or focal lesion. Sinuses/Orbits: No acute finding. Other: None. 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 No acute abnormality is noted. Electronically Signed   By: Alcide Clever M.D.   On: 06/03/2016 10:34    EKG: Independently reviewed.  Assessment/Plan Active Problems:   Stroke-like symptoms   Non-STEMI (non-ST elevated myocardial infarction) (HCC)   CAD S/P percutaneous coronary angioplasty   Chronic systolic CHF (congestive heart failure) (HCC)   Former smoker     Stroke like symptoms , as evidenced by Acute- rightsided weakness and dysarthria, suspected subcortical infarct. Symptoms improved since admisssion. Received ASA on presentation and Plavix  CT head shows prior lacunar infarct in basal ganglia, without acute findings  . UDS neg.    Admit to Tele obs Stroke order set  MRI/MRA brain   MRA neck Allow permissive HTN Echo  PT/OT/SLP  lipid panel A1C Aspirin  Neuro  Following, appreciate involvement    CAD s/p PTA s/p NSTEMI in 2016  ,  EKG unrevealing,  Tn neg , patient is cardiac pain free at this time. Continue ASA, Plavix, coreg and Zocor   Chronic systolic  CHF/ ICM   CXR unremarkable,Appaears compensated. Last 2 D echo 2016 normal EF and LVF .  Weight 172 lbs   continue Coreg monitor I/Os and daily weights prn 02    DVT prophylaxis: Lovenox Code Status:   Full     Family Communication:  Discussed with patient Disposition Plan: Expect patient to be discharged to home after condition improves Consults called:   Neurology by EDP   Admission status:Tele Obs  [facility] E, PA-C [facility]   06/03/2016, 1:34 PM

## 2016-06-03 NOTE — ED Notes (Signed)
Attempted report to [id number] x1

## 2016-06-04 ENCOUNTER — Observation Stay (HOSPITAL_COMMUNITY)

## 2016-06-04 DIAGNOSIS — R29810 Facial weakness: Secondary | ICD-10-CM | POA: Diagnosis present

## 2016-06-04 DIAGNOSIS — Z8673 Personal history of transient ischemic attack (TIA), and cerebral infarction without residual deficits: Secondary | ICD-10-CM | POA: Diagnosis not present

## 2016-06-04 DIAGNOSIS — I639 Cerebral infarction, unspecified: Secondary | ICD-10-CM

## 2016-06-04 DIAGNOSIS — I214 Non-ST elevation (NSTEMI) myocardial infarction: Secondary | ICD-10-CM

## 2016-06-04 DIAGNOSIS — I6789 Other cerebrovascular disease: Secondary | ICD-10-CM

## 2016-06-04 DIAGNOSIS — I5022 Chronic systolic (congestive) heart failure: Secondary | ICD-10-CM | POA: Diagnosis present

## 2016-06-04 DIAGNOSIS — I255 Ischemic cardiomyopathy: Secondary | ICD-10-CM | POA: Diagnosis present

## 2016-06-04 DIAGNOSIS — I2510 Atherosclerotic heart disease of native coronary artery without angina pectoris: Secondary | ICD-10-CM | POA: Diagnosis present

## 2016-06-04 DIAGNOSIS — I252 Old myocardial infarction: Secondary | ICD-10-CM | POA: Diagnosis not present

## 2016-06-04 DIAGNOSIS — E785 Hyperlipidemia, unspecified: Secondary | ICD-10-CM | POA: Diagnosis present

## 2016-06-04 DIAGNOSIS — Z955 Presence of coronary angioplasty implant and graft: Secondary | ICD-10-CM | POA: Diagnosis not present

## 2016-06-04 DIAGNOSIS — I110 Hypertensive heart disease with heart failure: Secondary | ICD-10-CM | POA: Diagnosis present

## 2016-06-04 DIAGNOSIS — I6340 Cerebral infarction due to embolism of unspecified cerebral artery: Secondary | ICD-10-CM | POA: Diagnosis present

## 2016-06-04 DIAGNOSIS — R2990 Unspecified symptoms and signs involving the nervous system: Secondary | ICD-10-CM | POA: Diagnosis not present

## 2016-06-04 DIAGNOSIS — E784 Other hyperlipidemia: Secondary | ICD-10-CM | POA: Diagnosis present

## 2016-06-04 DIAGNOSIS — Z79899 Other long term (current) drug therapy: Secondary | ICD-10-CM | POA: Diagnosis not present

## 2016-06-04 DIAGNOSIS — Z87891 Personal history of nicotine dependence: Secondary | ICD-10-CM | POA: Diagnosis not present

## 2016-06-04 DIAGNOSIS — R471 Dysarthria and anarthria: Secondary | ICD-10-CM | POA: Diagnosis present

## 2016-06-04 DIAGNOSIS — G8321 Monoplegia of upper limb affecting right dominant side: Secondary | ICD-10-CM | POA: Diagnosis present

## 2016-06-04 DIAGNOSIS — Z7902 Long term (current) use of antithrombotics/antiplatelets: Secondary | ICD-10-CM | POA: Diagnosis not present

## 2016-06-04 LAB — LIPID PANEL
CHOL/HDL RATIO: 4 ratio
Cholesterol: 112 mg/dL (ref 0–200)
HDL: 28 mg/dL — AB (ref >40)
LDL CALC: 61 mg/dL (ref 0–99)
TRIGLYCERIDES: 117 mg/dL (ref <150)
VLDL: 23 mg/dL (ref 0–40)

## 2016-06-04 LAB — HIV ANTIBODY (ROUTINE TESTING W REFLEX): HIV SCREEN 4TH GENERATION: NONREACTIVE

## 2016-06-04 LAB — ECHOCARDIOGRAM COMPLETE

## 2016-06-04 MED ORDER — ASPIRIN 325 MG PO TABS
325.0000 mg | ORAL_TABLET | Freq: Every day | ORAL | Status: DC
  Administered 2016-06-04 – 2016-06-05 (×2): 325 mg via ORAL
  Filled 2016-06-04 (×2): qty 1

## 2016-06-04 NOTE — Progress Notes (Signed)
PROGRESS NOTE   Kyle Pittman  ZOX:[id number]  DOB: December 05, 1955  DOA: 06/03/2016 PCP: Dessa Phi, MD  Hospital course: Kyle Pittman is a 61 y.o. male with medical history significant for CAD s/p MI 2016 with stent placement, ICM, presenting with acute onset of L facial numbness, right facial droop, dysarthria, and R upper extremity weakness. Symptoms woke patient up from sleep and improved at the ED and noted to have Acute CVA on MRI.   Assessment & Plan:   Acute CVA - admitted for further workup including echocardiogram, PT/OT/SLP, lipids, A1c, Aspirin, Neurology consulted.    CAD s/p PTA s/p NSTEMI in 2016   EKG unrevealing,  Troponin neg, patient is chest pain free. Continue ASA, Plavix, coreg and Zocor   Chronic systolic  CHF/ ICM   CXR unremarkable, Appears compensated. Last 2 D echo 2016 normal EF and LVF.  Weight 172 lbs   continue Coreg, monitor I/Os and daily weights, prn 02   DVT prophylaxis: Lovenox Code Status:   Full     Family Communication:  Discussed with patient Disposition Plan: Expect patient to be discharged to home after condition improves Consults called:   Neurology by EDP   Admission status:Tele Obs  Subjective: Pt says that symptoms have been improving.   Objective: Vitals:   06/03/16 2112 06/04/16 0107 06/04/16 0531 06/04/16 0950  BP: 127/74 (!) 130/98 126/84 107/79  Pulse: 86 67 67 84  Resp: 18 16 16 18   Temp: 98.9 F (37.2 C) 98.6 F (37 C) 97.9 F (36.6 C) 98.6 F (37 C)  TempSrc: Oral Oral Oral Oral  SpO2: 97% 99% 98% 97%    Intake/Output Summary (Last 24 hours) at 06/04/16 1221 Last data filed at 06/04/16 0900  Gross per 24 hour  Intake              240 ml  Output              600 ml  Net             -360 ml   There were no vitals filed for this visit.  Exam:  General exam: awake, alert, NAD. Cooperative.   Respiratory system: Clear. No increased work of breathing. Cardiovascular system: S1 & S2 heard, RRR. No  JVD, murmurs, gallops, clicks or pedal edema. Gastrointestinal system: Abdomen is nondistended, soft and nontender. Normal bowel sounds heard. Central nervous system: Alert and oriented. nonfocal per my exam. Extremities: no CCE.  Data Reviewed: Basic Metabolic Panel:  Recent Labs Lab 06/03/16 1016  NA 138  K 4.0  CL 105  CO2 27  GLUCOSE 111*  BUN 5*  CREATININE 0.83  CALCIUM 9.1   Liver Function Tests:  Recent Labs Lab 06/03/16 1016  AST 20  ALT 27  ALKPHOS 58  BILITOT 0.6  PROT 6.8  ALBUMIN 3.9   No results for input(s): LIPASE, AMYLASE in the last 168 hours. No results for input(s): AMMONIA in the last 168 hours. CBC:  Recent Labs Lab 06/03/16 1016  WBC 5.5  NEUTROABS 1.9  HGB 15.0  HCT 44.7  MCV 85.0  PLT 223   Cardiac Enzymes:  Recent Labs Lab 06/03/16 1228  TROPONINI <0.03   CBG (last 3)   Recent Labs  06/03/16 1000  GLUCAP 108*   No results found for this or any previous visit (from the past 240 hour(s)).   Studies: Ct Head Wo Contrast  Result Date: 06/03/2016 CLINICAL DATA:  Right-sided facial droop and slurred speech EXAM:  CT HEAD WITHOUT CONTRAST TECHNIQUE: Contiguous axial images were obtained from the base of the skull through the vertex without intravenous contrast. COMPARISON:  None. FINDINGS: Brain: Small lacunar infarct is noted in the basal ganglia on the left laterally. No findings to suggest acute hemorrhage, acute infarction or space-occupying mass lesion are noted. Vascular: No hyperdense vessel or unexpected calcification. Skull: Normal. Negative for fracture or focal lesion. Sinuses/Orbits: No acute finding. Other: None. 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 No acute abnormality is noted. Electronically Signed   By: Alcide Clever M.D.   On: 06/03/2016 10:34   Mr Maxine Glenn Neck W Wo Contrast  Result Date: 06/03/2016 CLINICAL DATA:  RIGHT-sided weakness with slurred speech. EXAM: MRI HEAD WITHOUT AND WITH  CONTRAST AND MRA HEAD WITHOUT CONTRAST AND MRI NECK WITHOUT AND WITH CONTRAST TECHNIQUE: Multiplanar, multiecho pulse sequences of the brain and surrounding structures were obtained without and with intravenous contrast. Angiographic images of the head were obtained using MRA technique without and with contrast. Multiplanar, multiecho pulse sequences of the neck and surrounding structures were obtained without and with intravenous contrast. CONTRAST:  18mL MULTIHANCE GADOBENATE DIMEGLUMINE 529 MG/ML IV SOLN COMPARISON:  CT head earlier today. FINDINGS: MRI HEAD FINDINGS Brain: Punctate foci of restricted diffusion affect the LEFT posterior frontal cortex and subcortical white matter, consistent with acute infarction. No similar abnormalities on the RIGHT or involving the brainstem. No hemorrhage, mass lesion, hydrocephalus, or extra-axial fluid. Normal for age cerebral volume. Minor white matter disease, nonspecific, likely chronic microvascular ischemic change. Post infusion, no abnormal enhancement of the brain or meninges. Vascular: Normal flow voids. Skull and upper cervical spine: Normal marrow signal. Sinuses/Orbits: Negative. Other: None. MRA HEAD FINDINGS The internal carotid arteries appear widely patent. The basilar artery is widely patent with LEFT vertebral dominant, although both contribute. No proximal stenosis of the anterior, middle, or posterior cerebral arteries. There is focal narrowing, estimated 50% stenosis, RIGHT P2 PCA segment. No cerebellar branch occlusion. No saccular aneurysm. MRA NECK FINDINGS There is a common origin to the innominate and LEFT common carotid artery, so-called bovine trunk. No proximal stenosis of the great vessels including the subclavian arteries. Carotid bifurcations are free of disease. No cervical ICA dissection or fibromuscular change. Both vertebrals are patent and contribute to the basilar, LEFT dominant. Due to technical difficulties with the vascular slab, a  small portion of the proximal LEFT vertebral was excluded on the data set, but is likely normal. IMPRESSION: Small foci of acute LEFT posterior frontal cortical and subcortical LEFT centrum semiovale infarction, nonhemorrhagic, LEFT MCA territory. Mild small vessel disease. No abnormal postcontrast enhancement of brain or meninges. Unremarkable MRA of the intracranial and extracranial circulation. No proximal vascular stenosis, significant atheromatous change, or dissection is observed. Electronically Signed   By: Elsie Stain M.D.   On: 06/03/2016 15:37   Mr Laqueta Jean OZ Contrast  Result Date: 06/03/2016 CLINICAL DATA:  RIGHT-sided weakness with slurred speech. EXAM: MRI HEAD WITHOUT AND WITH CONTRAST AND MRA HEAD WITHOUT CONTRAST AND MRI NECK WITHOUT AND WITH CONTRAST TECHNIQUE: Multiplanar, multiecho pulse sequences of the brain and surrounding structures were obtained without and with intravenous contrast. Angiographic images of the head were obtained using MRA technique without and with contrast. Multiplanar, multiecho pulse sequences of the neck and surrounding structures were obtained without and with intravenous contrast. CONTRAST:  18mL MULTIHANCE GADOBENATE DIMEGLUMINE 529 MG/ML IV SOLN COMPARISON:  CT head earlier today. FINDINGS: MRI HEAD FINDINGS Brain: Punctate foci  of restricted diffusion affect the LEFT posterior frontal cortex and subcortical white matter, consistent with acute infarction. No similar abnormalities on the RIGHT or involving the brainstem. No hemorrhage, mass lesion, hydrocephalus, or extra-axial fluid. Normal for age cerebral volume. Minor white matter disease, nonspecific, likely chronic microvascular ischemic change. Post infusion, no abnormal enhancement of the brain or meninges. Vascular: Normal flow voids. Skull and upper cervical spine: Normal marrow signal. Sinuses/Orbits: Negative. Other: None. MRA HEAD FINDINGS The internal carotid arteries appear widely patent. The  basilar artery is widely patent with LEFT vertebral dominant, although both contribute. No proximal stenosis of the anterior, middle, or posterior cerebral arteries. There is focal narrowing, estimated 50% stenosis, RIGHT P2 PCA segment. No cerebellar branch occlusion. No saccular aneurysm. MRA NECK FINDINGS There is a common origin to the innominate and LEFT common carotid artery, so-called bovine trunk. No proximal stenosis of the great vessels including the subclavian arteries. Carotid bifurcations are free of disease. No cervical ICA dissection or fibromuscular change. Both vertebrals are patent and contribute to the basilar, LEFT dominant. Due to technical difficulties with the vascular slab, a small portion of the proximal LEFT vertebral was excluded on the data set, but is likely normal. IMPRESSION: Small foci of acute LEFT posterior frontal cortical and subcortical LEFT centrum semiovale infarction, nonhemorrhagic, LEFT MCA territory. Mild small vessel disease. No abnormal postcontrast enhancement of brain or meninges. Unremarkable MRA of the intracranial and extracranial circulation. No proximal vascular stenosis, significant atheromatous change, or dissection is observed. Electronically Signed   By: Elsie Stain M.D.   On: 06/03/2016 15:37   Mr Maxine Glenn Head/brain ZO Cm  Result Date: 06/03/2016 CLINICAL DATA:  RIGHT-sided weakness with slurred speech. EXAM: MRI HEAD WITHOUT AND WITH CONTRAST AND MRA HEAD WITHOUT CONTRAST AND MRI NECK WITHOUT AND WITH CONTRAST TECHNIQUE: Multiplanar, multiecho pulse sequences of the brain and surrounding structures were obtained without and with intravenous contrast. Angiographic images of the head were obtained using MRA technique without and with contrast. Multiplanar, multiecho pulse sequences of the neck and surrounding structures were obtained without and with intravenous contrast. CONTRAST:  18mL MULTIHANCE GADOBENATE DIMEGLUMINE 529 MG/ML IV SOLN COMPARISON:  CT head  earlier today. FINDINGS: MRI HEAD FINDINGS Brain: Punctate foci of restricted diffusion affect the LEFT posterior frontal cortex and subcortical white matter, consistent with acute infarction. No similar abnormalities on the RIGHT or involving the brainstem. No hemorrhage, mass lesion, hydrocephalus, or extra-axial fluid. Normal for age cerebral volume. Minor white matter disease, nonspecific, likely chronic microvascular ischemic change. Post infusion, no abnormal enhancement of the brain or meninges. Vascular: Normal flow voids. Skull and upper cervical spine: Normal marrow signal. Sinuses/Orbits: Negative. Other: None. MRA HEAD FINDINGS The internal carotid arteries appear widely patent. The basilar artery is widely patent with LEFT vertebral dominant, although both contribute. No proximal stenosis of the anterior, middle, or posterior cerebral arteries. There is focal narrowing, estimated 50% stenosis, RIGHT P2 PCA segment. No cerebellar branch occlusion. No saccular aneurysm. MRA NECK FINDINGS There is a common origin to the innominate and LEFT common carotid artery, so-called bovine trunk. No proximal stenosis of the great vessels including the subclavian arteries. Carotid bifurcations are free of disease. No cervical ICA dissection or fibromuscular change. Both vertebrals are patent and contribute to the basilar, LEFT dominant. Due to technical difficulties with the vascular slab, a small portion of the proximal LEFT vertebral was excluded on the data set, but is likely normal. IMPRESSION: Small foci of acute LEFT posterior frontal  cortical and subcortical LEFT centrum semiovale infarction, nonhemorrhagic, LEFT MCA territory. Mild small vessel disease. No abnormal postcontrast enhancement of brain or meninges. Unremarkable MRA of the intracranial and extracranial circulation. No proximal vascular stenosis, significant atheromatous change, or dissection is observed. Electronically Signed   By: Elsie StainJohn T Curnes  M.D.   On: 06/03/2016 15:37   Scheduled Meds: . carvedilol  6.25 mg Oral BID  . clopidogrel  75 mg Oral Daily  . enoxaparin (LOVENOX) injection  40 mg Subcutaneous Q24H  . simvastatin  20 mg Oral QPM   Continuous Infusions:  Active Problems:   Acute CVA (cerebrovascular accident) (HCC)   Non-STEMI (non-ST elevated myocardial infarction) (HCC)   CAD S/P percutaneous coronary angioplasty   Chronic systolic CHF (congestive heart failure) (HCC)   Former smoker   Stroke-like symptoms   Other hyperlipidemia  Time spent:   Standley Dak[place]ford Jonay Hitchcock, MD [facility] Pager [phone]  If 7PM-7AM, please contact night-coverage [email or web address] Password [email or web address] 06/04/2016, 12:21 PM    LOS: 0 days

## 2016-06-04 NOTE — Evaluation (Signed)
Physical Therapy Evaluation Patient Details Name: Kyle Pittman MRN: [id number] DOB: 17-Jul-1955 Today's Date: 06/04/2016   History of Present Illness  Pt is a 61 y.o. male who presented to the ED with acute onset of L facial numbness, right facial droop, dysarthria, and R UE weakness. Symptoms woke pt up from sleep. He has a PMH significant for CAD s/p MI 2016 with stent placement, high cholesterol, ischemic cardiomyopathy, and MI (2012, 2016). MRI revealed small foci of acute L posterior frontal cortical and subcortical L centrum semiovale infarction, nonhemorrhagic, L MCA territory.  Clinical Impression  Pt admitted with above diagnosis. Pt currently with functional limitations due to the deficits listed below (see PT Problem List). At the time of PT eval pt was able to perform transfers and ambulation with gross min guard assist. Deficits appear to be mainly in RUE, however strength in RLE was 5/5 with MMT. No reports of N/T in LE's. Pt will benefit from skilled PT to increase their independence and safety with mobility to allow discharge to the venue listed below.       Follow Up Recommendations No PT follow up;Supervision - Intermittent    Equipment Recommendations  None recommended by PT    Recommendations for Other Services       Precautions / Restrictions Precautions Precautions: Fall Restrictions Weight Bearing Restrictions: No      Mobility  Bed Mobility Overal bed mobility: Needs Assistance Bed Mobility: Supine to Sit     Supine to sit: Supervision     General bed mobility comments: Pt sitting EOB when PT arrived.  Transfers Overall transfer level: Needs assistance Equipment used: None Transfers: Sit to/from Stand Sit to Stand: Min guard         General transfer comment: Min guard assist for steadying.  Ambulation/Gait Ambulation/Gait assistance: Min guard Ambulation Distance (Feet): 100 Feet Assistive device: None Gait Pattern/deviations:  Step-through pattern;Decreased stride length;Trunk flexed;Narrow base of support Gait velocity: Decreased Gait velocity interpretation: Below normal speed for age/gender General Gait Details: Gait training somewhat limited by LE shackles. Pt overall ambulating well. Noted significant slowing of pace during head turns. Slowed more during vertical head turns vs horizontal. Pt reports mild dizziness and blurred vision. States he wears glasses however declined wearing them during session.   Stairs            Wheelchair Mobility    Modified Rankin (Stroke Patients Only)       Balance Overall balance assessment: Needs assistance Sitting-balance support: No upper extremity supported;Feet supported Sitting balance-Leahy Scale: Good     Standing balance support: No upper extremity supported;During functional activity Standing balance-Leahy Scale: Fair                               Pertinent Vitals/Pain Pain Assessment: 0-10 Pain Score: 4  Pain Location: R hand Pain Descriptors / Indicators: Tingling    Home Living Family/patient expects to be discharged to:: [facility]                      Prior Function Level of Independence: Independent         Comments: Reports independence with ADL      Hand Dominance   Dominant Hand: Right    Extremity/Trunk Assessment   Upper Extremity Assessment Upper Extremity Assessment: Defer to OT evaluation RUE Deficits / Details: 3+/5 strength grossly. Decreased coordination impacting ability to complete self-feeding tasks and grooming tasks.  Lower Extremity Assessment Lower Extremity Assessment: LLE deficits/detail LLE Deficits / Details: Noted mild strength deficit in L quads during MMT. RLE 5/5 in hip flexors, quads, hams, DF.  LLE Sensation:  (Pt denies sensory deficits bilaterally)       Communication   Communication:  (reports slurring remains slightly compared to baseline. )  Cognition  Arousal/Alertness: Awake/alert Behavior During Therapy: WFL for tasks assessed/performed                                          General Comments      Exercises     Assessment/Plan    PT Assessment Patient needs continued PT services  PT Problem List Decreased strength;Decreased range of motion;Decreased activity tolerance;Decreased balance;Decreased mobility;Decreased knowledge of use of DME;Decreased safety awareness;Decreased knowledge of precautions       PT Treatment Interventions DME instruction;Stair training;Gait training;Functional mobility training;Therapeutic activities;Therapeutic exercise;Neuromuscular re-education;Patient/family education    PT Goals (Current goals can be found in the Care Plan section)  Acute Rehab PT Goals Patient Stated Goal: to see a doctor PT Goal Formulation: With patient Time For Goal Achievement: 06/11/16 Potential to Achieve Goals: Good    Frequency Min 3X/week   Barriers to discharge        Co-evaluation               AM-PAC PT "6 Clicks" Daily Activity  Outcome Measure Difficulty turning over in bed (including adjusting bedclothes, sheets and blankets)?: None Difficulty moving from lying on back to sitting on the side of the bed? : None Difficulty sitting down on and standing up from a chair with arms (e.g., wheelchair, bedside commode, etc,.)?: None Help needed moving to and from a bed to chair (including a wheelchair)?: A Little Help needed walking in hospital room?: A Little Help needed climbing 3-5 steps with a railing? : A Little 6 Click Score: 21    End of Session Equipment Utilized During Treatment: Gait belt Activity Tolerance: Patient tolerated treatment well Patient left: in chair;with call bell/phone within reach;Other (comment) (2 officers present) Nurse Communication: Mobility status PT Visit Diagnosis: Unsteadiness on feet (R26.81)    Time: [id number] PT Time Calculation (min) (ACUTE  ONLY): 17 min   Charges:   PT Evaluation $PT Eval Moderate Complexity: 1 Procedure     PT G Codes:   PT G-Codes **NOT FOR INPATIENT CLASS** Functional Assessment Tool Used: Clinical judgement Functional Limitation: Mobility: Walking and moving around Mobility: Walking and Moving Around Current Status ([id number](G8978): At least 20 percent but less than 40 percent impaired, limited or restricted Mobility: Walking and Moving Around Goal Status [phone](G8979): At least 1 percent but less than 20 percent impaired, limited or restricted    Conni SlipperLaura Nazario Russom, PT, DPT Acute Rehabilitation Services Pager: [phone]   Marylynn PearsonLaura D Doshia Dalia 06/04/2016, 9:47 AM

## 2016-06-04 NOTE — Progress Notes (Signed)
STROKE TEAM PROGRESS NOTE   SUBJECTIVE (INTERVAL HISTORY) Patient has 2 [place] marshals guarding him at bedside.He has persistent right-sided weakness and numbness. He denies any prior history of strokes or TIAs.   OBJECTIVE Temp:  [97.9 F (36.6 C)-98.9 F (37.2 C)] 97.9 F (36.6 C) (05/29 0531) Pulse Rate:  [67-86] 67 (05/29 0531) Cardiac Rhythm: Normal sinus rhythm (05/29 0700) Resp:  [16-23] 16 (05/29 0531) BP: (126-159)/(74-113) 126/84 (05/29 0531) SpO2:  [97 %-100 %] 98 % (05/29 0531)  CBC:  Recent Labs Lab 06/03/16 1016  WBC 5.5  NEUTROABS 1.9  HGB 15.0  HCT 44.7  MCV 85.0  PLT 223    Basic Metabolic Panel:  Recent Labs Lab 06/03/16 1016  NA 138  K 4.0  CL 105  CO2 27  GLUCOSE 111*  BUN 5*  CREATININE 0.83  CALCIUM 9.1   HgbA1c:  Lab Results  Component Value Date   HGBA1C 5.5 03/02/2015    PHYSICAL EXAM Pleasant middle-age African-American male currently not in distress. . Afebrile. Head is nontraumatic. Neck is supple without bruit.    Cardiac exam no murmur or gallop. Lungs are clear to auscultation. Distal pulses are well felt. Neurological Exam :  Awake alert oriented to time place and person. No dysarthria or aphasia. Pupils equal reactive. Fundi not visualized. Vision acuity is adequate. Extraocular moments are full range without nystagmus. Mild right lower facial asymmetry when he smiles. Tongue midline. Cough and gag are adequate. Motor system exam reveals no upper or lower eczema to drift but weakness of right grip, intrinsic hand muscles. Orbits left over right upper extremity. Fine finger movements are diminished on the right. He has subjective diminished right hemibody touch and pinprick sensation. Coordination is slow on the right compared to the left. Deep tendon flexes are symmetric. Plantars are downgoing. Gait was not tested. ASSESSMENT/PLAN Mr. Kyle Pittman is a 61 y.o. male with history of CAD s/p MI 2016 with stent placement, ICM  presenting with R facial numbness, R facial droop, dysarthria, RUE weakness. He did not receive IV t-PA due to delay in arrival.   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 No acute abnormality  MRI  Small L posterior frontal cortical and subcortical infarcts in L MCA territory. Small vessel disease.   MRA head  Unremarkable   MRA neck  Unremarkable  2D Echo  Left ventricle: The cavity size was normal. Wall thickness was   normal. Systolic function was mildly to moderately reduced. The   estimated ejection fraction was in the range of 40% to 45%.    Anterior and anteroseptal and apical hypokinesis  LDL 61  HgbA1c pending   Lovenox 40 mg sq daily for VTE prophylaxis  Diet Heart Room service appropriate? Yes; Fluid consistency: Thin  clopidogrel 75 mg daily prior to admission, now on clopidogrel 75 mg daily  Therapy recommendations:  OP OT  Disposition:  pending   Hypertension  Stable Permissive hypertension (OK if < 220/120) but gradually normalize in 5-7 days Long-term BP goal normotensive  Hyperlipidemia  Home meds:  zocor 20, resumed in hospital  LDL 61 goal  Continue statin at discharge  Other Stroke Risk Factors  Former Cigarette smoker  UDS / ETOH level negative   Coronary artery disease - MI s/p PTA  Ischemic cardiomyopathy, chronic systolic CHF  Hospital day # 0  I have personally examined this patient, reviewed notes, independently viewed imaging studies, participated in medical decision making and plan of care.ROS completed by me personally and pertinent positives fully documented  I have made any additions or clarifications directly to the above note. He presented with right hemiparesis and sensory loss secondary to small left brain subcortical infarct from small vessel disease. Recommend change Plavix to  aspirin for stroke prevention and aggressive risk factor modification. Physical occupational consults. Greater than 50% time during this 35 minute visit was spent on counseling and coordination of care about his lacunar infarct, stroke risk factors risk factor modification and answered questions  Delia HeadyPramod Quinlynn Cuthbert, MD Medical Director [facility] Pager: [phone] 06/04/2016 2:30 PM   To contact Stroke Continuity provider, please refer to [email or web address]. After hours, contact General Neurology

## 2016-06-04 NOTE — Care Management Note (Signed)
Case Management Note  Patient Details  Name: Kyle Pittman AgeJames XXXFuller MRN: [id number] Date of Birth: 1955-11-01  Subjective/Objective:   [place] with CVA[place]                 Action/Plan: No f/u per PT and outpatient therapy per OT. Plan is [facility] when medically [facility]. CM following.  Expected Discharge Date:                  Expected Discharge Plan:     In-House Referral:     Discharge planning Services     Post Acute Care Choice:    Choice offered to:     DME Arranged:    DME Agency:     HH Arranged:    HH Agency:     Status of Service:  In process, will continue to follow  If discussed at Long Length of Stay Meetings, dates discussed:    Additional Comments:  Kermit BaloKelli F Costas Sena, RN 06/04/2016, 1:19 PM

## 2016-06-04 NOTE — Progress Notes (Signed)
  Echocardiogram 2D Echocardiogram has been performed.  Alasha Mcguinness L Androw 06/04/2016, 10:48 AM

## 2016-06-04 NOTE — Evaluation (Signed)
Speech Language Pathology Evaluation Patient Details Name: Kyle Pittman AgeJames Pittman MRN: [id number] DOB: 19-Nov-1955 Today's Date: 06/04/2016 Time: 1410-1420 SLP Time Calculation (min) (ACUTE ONLY): 10 min  Problem List:  Patient Active Problem List   Diagnosis Date Noted  . Acute CVA (cerebrovascular accident) (HCC) 06/04/2016  . Stroke-like symptoms 06/03/2016  . Other hyperlipidemia   . Erectile dysfunction 03/02/2015  . Former smoker 03/02/2015  . Chronic systolic CHF (congestive heart failure) (HCC) 10/07/2014  . Non-STEMI (non-ST elevated myocardial infarction) (HCC)   . CAD S/P percutaneous coronary angioplasty    Past Medical History:  Past Medical History:  Diagnosis Date  . Coronary artery disease DX 2012  . High cholesterol Dx 2012  . Ischemic cardiomyopathy    a. Echo 7/16: EF 35%, inferoseptal and anteroseptal HK, apical anterior, apical inferior and apical lateral HK, grade 1 diastolic dysfunction, trivial MR, normal RV function, PASP 28 mmHg;  b. cMRI 8/16:  Mod LVE, mid and distal septal, apical, distal anterior AK (full thickness scar), EF 46%, no mural clot, mod LAE, normal R sided chambers, no effusion, normal aortic root  . MI (myocardial infarction) [facility](HCC)    June 2012 and Apr 2016   Past Surgical History:  Past Surgical History:  Procedure Laterality Date  . LEFT HEART CATHETERIZATION WITH CORONARY ANGIOGRAM N/A 05/03/2014   Procedure: LEFT HEART CATHETERIZATION WITH CORONARY ANGIOGRAM;  Surgeon: Marykay Lexavid W Harding, MD;  [facility];  Service: Cardiovascular;  Laterality: N/A;  . PERCUTANEOUS CORONARY STENT INTERVENTION (PCI-S)  05/03/2014   Procedure: PERCUTANEOUS CORONARY STENT INTERVENTION (PCI-S);  Surgeon: Marykay Lexavid W Harding, MD;  [facility];  Service: Cardiovascular;;   HPI:  Pt is a 61 y.o. male who presented to the ED with acute onset of L facial numbness, right facial droop, dysarthria, and R UE weakness. Symptoms woke pt up from sleep. He has a  PMH significant for CAD s/p MI 2016 with stent placement, high cholesterol, ischemic cardiomyopathy, and MI (2012, 2016). MRI revealed small foci of acute L posterior frontal cortical and subcortical L centrum semiovale infarction, nonhemorrhagic, L MCA territory   Assessment / Plan / Recommendation Clinical Impression  Pt presents with normal cognitive-language function; fluent speech; no notable dysarthria.  Speech with low volume, but fully intelligible.  No acute changes noted per assessment.  No SLP f/u warranted.     SLP Assessment  SLP Recommendation/Assessment: Patient does not need any further Speech Lanaguage Pathology Services SLP Visit Diagnosis: Aphasia (R47.01)    Follow Up Recommendations  None    Frequency and Duration           SLP Evaluation Cognition  Overall Cognitive Status: Within Functional Limits for tasks assessed       Comprehension  Auditory Comprehension Overall Auditory Comprehension: Appears within functional limits for tasks assessed Yes/No Questions: Within Functional Limits Commands: Within Functional Limits Conversation: Simple Visual Recognition/Discrimination Discrimination: Within Function Limits Reading Comprehension Reading Status: Not tested    Expression Expression Primary Mode of Expression: Verbal Verbal Expression Overall Verbal Expression: Appears within functional limits for tasks assessed Initiation: No impairment Level of Generative/Spontaneous Verbalization: Conversation Repetition: No impairment Naming: No impairment   Oral / Motor  Oral Motor/Sensory Function Overall Oral Motor/Sensory Function: Within functional limits Motor Speech Overall Motor Speech: Appears within functional limits for tasks assessed   GO                    Kyle Pittman, Kyle Pittman 06/04/2016, 2:34 PM

## 2016-06-04 NOTE — Evaluation (Signed)
Occupational Therapy Evaluation Patient Details Name: Kyle Pittman MRN: [id number] DOB: [birth year] Today's Date: 06/04/2016    History of Present Illness Pt is a 61 y.o. male who presented to the ED with acute onset of L facial numbness, right facial droop, dysarthria, and R UE weakness. Symptoms woke pt up from sleep. He has a PMH significant for CAD s/p MI 2016 with stent placement, high cholesterol, ischemic cardiomyopathy, and MI (2012, 2016). MRI revealed small foci of acute L posterior frontal cortical and subcortical L centrum semiovale infarction, nonhemorrhagic, L MCA territory.   Clinical Impression   PTA, pt reports independence with basic ADL and functional mobility. Pt currently requires min assist for self-feeding with R UE and min guard assist for grooming and dressing tasks. Pt presents with decreased R UE strength and coordination impacting ability to participate in ADL at PLOF. He additionally demonstrates decreased ability to sustain eye position to the R during tracking tasks. Recommend outpatient OT services post-acute D/C in order to maximize functional use of R UE and independence with ADL. OT will continue to follow acutely.      Follow Up Recommendations  Outpatient OT    Equipment Recommendations  None recommended by OT    Recommendations for Other Services       Precautions / Restrictions Precautions Precautions: Fall Restrictions Weight Bearing Restrictions: No      Mobility Bed Mobility Overal bed mobility: Needs Assistance Bed Mobility: Supine to Sit     Supine to sit: Supervision     General bed mobility comments: Supervision for safety.   Transfers Overall transfer level: Needs assistance Equipment used: None Transfers: Sit to/from Stand Sit to Stand: Min guard         General transfer comment: Min guard assist for steadying.    Balance Overall balance assessment: Needs assistance Sitting-balance support: No upper extremity  supported;Feet supported Sitting balance-Leahy Scale: Good     Standing balance support: No upper extremity supported;During functional activity Standing balance-Leahy Scale: Fair                             ADL either performed or assessed with clinical judgement   ADL Overall ADL's : Needs assistance/impaired Eating/Feeding: Minimal assistance;Sitting Eating/Feeding Details (indicate cue type and reason): Min assist when utilizing R UE. No assist with L. Grooming: Min guard;Sitting Grooming Details (indicate cue type and reason): Difficulty coordinating R UE when brushing teeth often missing teeth or mouth.  Upper Body Bathing: Supervision/ safety;Sitting   Lower Body Bathing: Min guard;Sit to/from stand   Upper Body Dressing : Supervision/safety;Sitting   Lower Body Dressing: Min guard;Sit to/from stand   Toilet Transfer: Min guard;Ambulation   Toileting- Clothing Manipulation and Hygiene: Min guard;Sit to/from stand       Functional mobility during ADLs: Min guard General ADL Comments: Pt with decreased R UE strength and coordination limiting self-feeding and grooming tasks with R UE and pt is R handed.      Vision Baseline Vision/History: Wears glasses Wears Glasses:  (one lens missing) Patient Visual Report: Blurring of vision (slight blurring with and without glasses) Vision Assessment?: Yes Eye Alignment: Within Functional Limits Ocular Range of Motion: Within Functional Limits Alignment/Gaze Preference: Within Defined Limits Tracking/Visual Pursuits:  (decreased ability to sustain R superior gaze) Saccades: Within functional limits Visual Fields: No apparent deficits Additional Comments: Reports some blurry vision. He does wear glasses but these are broken (only have 1 lens). Difficulty sustaining  gaze out of midline to R superior field.      Perception     Praxis Praxis Praxis tested?: Within functional limits    Pertinent Vitals/Pain Pain  Assessment: 0-10 Pain Score: 4  Pain Location: R hand Pain Descriptors / Indicators: Tingling     Hand Dominance Right   Extremity/Trunk Assessment Upper Extremity Assessment Upper Extremity Assessment: RUE deficits/detail RUE Deficits / Details: 3+/5 strength grossly. Decreased coordination impacting ability to complete self-feeding tasks and grooming tasks.            Communication Communication Communication:  (reports slurring remains slightly compared to baseline. )   Cognition Arousal/Alertness: Awake/alert Behavior During Therapy: WFL for tasks assessed/performed                                       General Comments       Exercises     Shoulder Instructions      Home Living Family/patient expects to be discharged to:: [facility]                                        Prior Functioning/Environment Level of Independence: Independent        Comments: Reports independence with ADL         OT Problem List: Decreased strength;Decreased range of motion;Decreased activity tolerance;Impaired balance (sitting and/or standing);Decreased safety awareness;Decreased knowledge of use of DME or AE;Decreased knowledge of precautions;Impaired UE functional use      OT Treatment/Interventions: Self-care/ADL training;Therapeutic exercise;Energy conservation;DME and/or AE instruction;Therapeutic activities;Patient/family education;Balance training    OT Goals(Current goals can be found in the care plan section) Acute Rehab OT Goals Patient Stated Goal: to see a doctor OT Goal Formulation: With patient Time For Goal Achievement: 06/18/16 Potential to Achieve Goals: Good ADL Goals Pt Will Perform Eating: with modified independence;with adaptive utensils;sitting Pt Will Perform Grooming: with modified independence;with adaptive equipment;standing Pt Will Transfer to Toilet: with modified independence;ambulating;regular height  toilet Pt Will Perform Toileting - Clothing Manipulation and hygiene: with modified independence;sit to/from stand Pt/caregiver will Perform Home Exercise Program: Right Upper extremity;With written HEP provided;With Supervision (increased strength, fine motor and gross motor coordination)  OT Frequency: Min 2X/week   Barriers to D/C:            Co-evaluation              AM-PAC PT "6 Clicks" Daily Activity     Outcome Measure Help from another person eating meals?: A Little Help from another person taking care of personal grooming?: A Little Help from another person toileting, which includes using toliet, bedpan, or urinal?: A Little Help from another person bathing (including washing, rinsing, drying)?: A Little Help from another person to put on and taking off regular upper body clothing?: A Little Help from another person to put on and taking off regular lower body clothing?: A Little 6 Click Score: 18   End of Session Nurse Communication: Mobility status  Activity Tolerance: Patient tolerated treatment well Patient left: Other (comment) (ambulating in hallway with PT)  OT Visit Diagnosis: Hemiplegia and hemiparesis;Unsteadiness on feet (R26.81) Hemiplegia - Right/Left: Right Hemiplegia - dominant/non-dominant: Dominant Hemiplegia - caused by: Cerebral infarction                Time: 1610-9604 OT Time Calculation (min):  20 min Charges:  OT General Charges $OT Visit: 1 Procedure OT Evaluation $OT Eval Moderate Complexity: 1 Procedure G-Codes: OT G-codes **NOT FOR INPATIENT CLASS** Functional Assessment Tool Used: Clinical judgement Functional Limitation: Self care Self Care Current Status (W0981(G8987): At least 1 percent but less than 20 percent impaired, limited or restricted Self Care Goal Status ([id number](G8988): 0 percent impaired, limited or restricted   Doristine Sectionharity A Clevon Khader, MS OTR/L  Pager: [phone]   Steve Gregg A Nakyra Bourn 06/04/2016, 9:06 AM

## 2016-06-05 LAB — CBC
HEMATOCRIT: 45.4 % (ref 39.0–52.0)
Hemoglobin: 15.4 g/dL (ref 13.0–17.0)
MCH: 28.6 pg (ref 26.0–34.0)
MCHC: 33.9 g/dL (ref 30.0–36.0)
MCV: 84.2 fL (ref 78.0–100.0)
Platelets: 236 10*3/uL (ref 150–400)
RBC: 5.39 MIL/uL (ref 4.22–5.81)
RDW: 13.3 % (ref 11.5–15.5)
WBC: 5.8 10*3/uL (ref 4.0–10.5)

## 2016-06-05 LAB — COMPREHENSIVE METABOLIC PANEL
ALT: 23 U/L (ref 17–63)
AST: 21 U/L (ref 15–41)
Albumin: 3.7 g/dL (ref 3.5–5.0)
Alkaline Phosphatase: 54 U/L (ref 38–126)
Anion gap: 8 (ref 5–15)
BILIRUBIN TOTAL: 0.7 mg/dL (ref 0.3–1.2)
BUN: 9 mg/dL (ref 6–20)
CO2: 28 mmol/L (ref 22–32)
CREATININE: 0.87 mg/dL (ref 0.61–1.24)
Calcium: 9.2 mg/dL (ref 8.9–10.3)
Chloride: 102 mmol/L (ref 101–111)
Glucose, Bld: 108 mg/dL — ABNORMAL HIGH (ref 65–99)
POTASSIUM: 4 mmol/L (ref 3.5–5.1)
Sodium: 138 mmol/L (ref 135–145)
TOTAL PROTEIN: 6.8 g/dL (ref 6.5–8.1)

## 2016-06-05 LAB — HEMOGLOBIN A1C
Hgb A1c MFr Bld: 6 % — ABNORMAL HIGH (ref 4.8–5.6)
Mean Plasma Glucose: 126 mg/dL

## 2016-06-05 MED ORDER — CARVEDILOL 6.25 MG PO TABS: 6.2500 mg | ORAL_TABLET | Freq: Two times a day (BID) | ORAL | 0 refills | Status: AC

## 2016-06-05 MED ORDER — CLOPIDOGREL BISULFATE 75 MG PO TABS: 75.0000 mg | ORAL_TABLET | Freq: Every day | ORAL | 0 refills | Status: AC

## 2016-06-05 MED ORDER — SIMVASTATIN 20 MG PO TABS: 20.0000 mg | ORAL_TABLET | Freq: Every evening | ORAL | 0 refills | Status: AC

## 2016-06-05 NOTE — Progress Notes (Signed)
Physical Therapy Treatment Patient Details Name: Kyle Pittman MRN: [id number] DOB: [birth year] Today's Date: 06/05/2016    History of Present Illness Pt is a 61 y.o. male who presented to the ED with acute onset of L facial numbness, right facial droop, dysarthria, and R UE weakness. Symptoms woke pt up from sleep. He has a PMH significant for CAD s/p MI 2016 with stent placement, high cholesterol, ischemic cardiomyopathy, and MI (2012, 2016). MRI revealed small foci of acute L posterior frontal cortical and subcortical L centrum semiovale infarction, nonhemorrhagic, L MCA territory.    PT Comments    Pt progressing well towards goals. Increased steadiness with gait, and no LOB noted during dynamic balance activities. Supervision to min guard required for safety for all mobility, but no external assist required from PT for balance. Current recommendations appropriate and [facility]. Overall safe from mobility standpoint without use of AD. Will continue to follow acutely.    Follow Up Recommendations  No PT follow up;Supervision - Intermittent     Equipment Recommendations  None recommended by PT    Recommendations for Other Services       Precautions / Restrictions Precautions Precautions: Fall Restrictions Weight Bearing Restrictions: No    Mobility  Bed Mobility Overal bed mobility: Modified Independent             General bed mobility comments: extra time, but no assist required.   Transfers Overall transfer level: Needs assistance Equipment used: None Transfers: Sit to/from Stand Sit to Stand: Supervision         General transfer comment: Supervision for safety   Ambulation/Gait Ambulation/Gait assistance: Min guard;Supervision Ambulation Distance (Feet): 150 Feet Assistive device: None Gait Pattern/deviations: Step-through pattern;Decreased stride length;Trunk flexed;Narrow base of support Gait velocity: Decreased Gait velocity  interpretation: Below normal speed for age/gender General Gait Details: Pt wearing glassess this session. Limited in dynamic balance activities performed secondary to LE shackles. No LOB with vertical/horizontal head turns, negotiating obstacles, and pivot/turns. Min guard to supervision for safety.    Stairs            Wheelchair Mobility    Modified Rankin (Stroke Patients Only)       Balance Overall balance assessment: Needs assistance Sitting-balance support: No upper extremity supported;Feet supported Sitting balance-Leahy Scale: Good     Standing balance support: No upper extremity supported;During functional activity Standing balance-Leahy Scale: Fair Standing balance comment: Able to perform dynamic balance tasks during gait, however, slowed speed.                  Standardized Balance Assessment Standardized Balance Assessment : Dynamic Gait Index   Dynamic Gait Index Level Surface: Mild Impairment Gait with Horizontal Head Turns: Mild Impairment Gait with Vertical Head Turns: Mild Impairment Gait and Pivot Turn: Mild Impairment Step Around Obstacles: Mild Impairment      Cognition Arousal/Alertness: Awake/alert Behavior During Therapy: WFL for tasks assessed/performed Overall Cognitive Status: Within Functional Limits for tasks assessed                                        Exercises Other Exercises Other Exercises: Narrow BOS; eyes open, 30 sec, no sway Other Exercises: Narrow BOS; eyes closed, 30 seconds; increased sway, but no LOB Other Exercises: Semi-tandem; eyes open, 30 seconds X2; no LOB Other Exercises: Semi-tandem; eyes closed, 30 seconds X2; increased sway but no LOB  General Comments        Pertinent Vitals/Pain Pain Assessment: No/denies pain    Home Living                      Prior Function            PT Goals (current goals can now be found in the care plan section) Acute Rehab PT  Goals Patient Stated Goal: none stated PT Goal Formulation: With patient Time For Goal Achievement: 06/11/16 Potential to Achieve Goals: Good Progress towards PT goals: Progressing toward goals    Frequency    Min 3X/week      PT Plan Current plan remains appropriate    Co-evaluation              AM-PAC PT "6 Clicks" Daily Activity  Outcome Measure  Difficulty turning over in bed (including adjusting bedclothes, sheets and blankets)?: None Difficulty moving from lying on back to sitting on the side of the bed? : None Difficulty sitting down on and standing up from a chair with arms (e.g., wheelchair, bedside commode, etc,.)?: None Help needed moving to and from a bed to chair (including a wheelchair)?: A Little Help needed walking in hospital room?: A Little Help needed climbing 3-5 steps with a railing? : A Little 6 Click Score: 21    End of Session Equipment Utilized During Treatment: Gait belt Activity Tolerance: Patient tolerated treatment well Patient left: in bed;with call bell/phone within reach;Other (comment) (2 officers present ) Nurse Communication: Mobility status PT Visit Diagnosis: Unsteadiness on feet (R26.81)     Time: 1610-9604 PT Time Calculation (min) (ACUTE ONLY): 12 min  Charges:  $Gait Training: 8-22 mins                    G Codes:       Margot Chimes, PT, DPT  Acute Rehabilitation Services  Pager: [phone]    Melvyn Novas 06/05/2016, 2:04 PM

## 2016-06-05 NOTE — Progress Notes (Signed)
STROKE TEAM PROGRESS NOTE   SUBJECTIVE (INTERVAL HISTORY) Patient has 2 [place] marshals guarding him at bedside.He has noticed improvement in right-sided weakness and numbness.     OBJECTIVE Temp:  [97.9 F (36.6 C)-98.9 F (37.2 C)] 98.4 F (36.9 C) (05/30 0947) Pulse Rate:  [75-81] 75 (05/30 0947) Cardiac Rhythm: Normal sinus rhythm (05/30 0700) Resp:  [18] 18 (05/30 0947) BP: (103-123)/(68-86) 103/70 (05/30 0947) SpO2:  [96 %-98 %] 98 % (05/30 0947)  CBC:   Recent Labs Lab 06/03/16 1016 06/05/16 0431  WBC 5.5 5.8  NEUTROABS 1.9  --   HGB 15.0 15.4  HCT 44.7 45.4  MCV 85.0 84.2  PLT 223 236    Basic Metabolic Panel:   Recent Labs Lab 06/03/16 1016 06/05/16 0431  NA 138 138  K 4.0 4.0  CL 105 102  CO2 27 28  GLUCOSE 111* 108*  BUN 5* 9  CREATININE 0.83 0.87  CALCIUM 9.1 9.2   HgbA1c:  Lab Results  Component Value Date   HGBA1C 6.0 (H) 06/04/2016    PHYSICAL EXAM Pleasant middle-age African-American male currently not in distress. . Afebrile. Head is nontraumatic. Neck is supple without bruit.    Cardiac exam no murmur or gallop. Lungs are clear to auscultation. Distal pulses are well felt. Neurological Exam :  Awake alert oriented to time place and person. No dysarthria or aphasia. Pupils equal reactive. Fundi not visualized. Vision acuity is adequate. Extraocular moments are full range without nystagmus. Mild right lower facial asymmetry when he smiles. Tongue midline. Cough and gag are adequate. Motor system exam reveals no upper or lower eczema to drift but weakness of right grip, intrinsic hand muscles. Orbits left over right upper extremity. Fine finger movements are diminished on the right. He has subjective diminished right hemibody touch and pinprick sensation. Coordination is slow on the right compared to the left. Deep tendon flexes are symmetric. Plantars are downgoing. Gait was not tested. ASSESSMENT/PLAN Mr. Kyle Pittman is a 61 y.o. male with  history of CAD s/p MI 2016 with stent placement, ICM presenting with R facial numbness, R facial droop, dysarthria, RUE weakness. He did not receive IV t-PA due to delay in arrival.   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 No acute abnormality  MRI  Small L posterior frontal cortical and subcortical infarcts in L MCA territory. Small vessel disease.   MRA head  Unremarkable   MRA neck  Unremarkable  2D Echo  Left ventricle: The cavity size was normal. Wall thickness was   normal. Systolic function was mildly to moderately reduced. The   estimated ejection fraction was in the range of 40% to 45%.    Anterior and anteroseptal and apical hypokinesis  LDL 61  HgbA1c 6.0  Lovenox 40 mg sq daily for VTE prophylaxis Diet Heart Room service appropriate? Yes; Fluid consistency: Thin Diet - low sodium heart healthy  clopidogrel 75 mg daily prior to admission, now on clopidogrel 75 mg daily  Therapy recommendations:  OP OT  Disposition:  pending   Hypertension  Stable Permissive hypertension (OK if < 220/120) but gradually normalize in 5-7 days Long-term BP goal normotensive  Hyperlipidemia  Home meds:  zocor 20, resumed in hospital  LDL 61 goal  Continue statin at discharge  Other Stroke Risk Factors  Former Cigarette smoker  UDS / ETOH level  negative   Coronary artery disease - MI s/p PTA  Ischemic cardiomyopathy, chronic systolic CHF  Hospital day # 1  Recommend DC to jail today. Continue aspirin and outpatient therapies for weakness.D/W Dr Kyle Pittman  Kyle Asberry, MD Medical Director [facility] Pager: [phone] 06/05/2016 5:05 PM   To contact Stroke Continuity provider, please refer to [email or web address]. After hours, contact General Neurology

## 2016-06-05 NOTE — Discharge Summary (Signed)
Kyle Pittman ZOX:[id number] DOB: 03-13-1955 DOA: 06/03/2016  PCP: Dessa Phi, MD  Admit date: 06/03/2016  Discharge date: 06/05/2016  Ad[facility] From: [facility]   Disposition:  [facility]   Recommendations for Outpatient Follow-up:   Follow up with PCP in 1-2 weeks  PCP Please obtain BMP/CBC, 2 view CXR in 1week,  (see Discharge instructions)   PCP Please follow up on the following pending results: Monitor secondary risk factors for stroke   Home Health: None   Equipment/Devices: None  Consultations: Neurology Discharge Condition: Stable   CODE STATUS: Full   Diet Recommendation:  Heart Healthy    Chief Complaint  Patient presents with  . Numbness    Slurred speech - noticed on awakening, LSN 1 am     Brief history of present illness from the day of admission and additional interim summary    Kyle Pittman a 60 y.o.malewith medical history significant for CAD s/p MI 2016 with stent placement, ICM, presenting with acute onset of L facial numbness, right facial droop, dysarthria, and R upper extremity weakness. Symptoms woke patient up from sleep and improved at the ED and noted to have Acute CVA on MRI.                                                                  Hospital Course     1. Acute ischemic CVA with minimal residual right upper extremity weakness, likely due to small vessel disease per neurology, case discussed with neurologist Dr. Pearlean Brownie who had seen the patient. Discussed the plan with neurologist Dr. Pearlean Brownie, Plan is to continue him on Plavix and statin per home dose, his LDL was near goal of 70, A1c was stable. Outpatient follow-up with neuro. Initially plan was to switch from Plavix to statin but since he has significant heart issues Dr. Pearlean Brownie has decided to continue Plavix. Follow with  Dr. Pearlean Brownie outpatient in about 4 weeks post discharge.  2. CAD status post MI and stent placement in 2016. Continue home dose beta blocker, Plavix and statin for secondary prevention. Chest pain-free no acute issues.  3. Distal lipidemia. On statin LDL at goal of under 70.  4. Hypertension. Stable on Coreg. ACE inhibitor has been held as per pressure was borderline.  Discharge diagnosis     Active Problems:   Non-STEMI (non-ST elevated myocardial infarction) (HCC)   CAD S/P percutaneous coronary angioplasty   Chronic systolic CHF (congestive heart failure) (HCC)   Former smoker   Stroke-like symptoms   Other hyperlipidemia   Acute CVA (cerebrovascular accident) [facility])    Discharge instructions    Discharge Instructions    Diet - low sodium heart healthy    Complete by:  As directed    Discharge instructions    Complete by:  As directed  Follow with Primary MD Dessa Phi, MD in 7 days   Get CBC, CMP, 2 view Chest X ray checked  by Primary MD or SNF MD in 5-7 days ( we routinely change or add medications that can affect your baseline labs and fluid status, therefore we recommend that you get the mentioned basic workup next visit with your PCP, your PCP may decide not to get them or add new tests based on their clinical decision)  Activity: As tolerated with Full fall precautions use walker/cane & assistance as needed  Disposition [facility]  Diet:  Heart Healthy    For Heart failure patients - Check your Weight same time everyday, if you gain over 2 pounds, or you develop in leg swelling, experience more shortness of breath or chest pain, call your Primary MD immediately. Follow Cardiac Low Salt Diet and 1.5 lit/day fluid restriction.  On your next visit with your primary care physician please Get Medicines reviewed and adjusted.  Please request your Prim.MD to go over all Hospital Tests and Procedure/Radiological results at the follow up, please get all Hospital records  sent to your Prim MD by signing hospital release before you go home.  If you experience worsening of your admission symptoms, develop shortness of breath, life threatening emergency, suicidal or homicidal thoughts you must seek medical attention immediately by calling 911 or calling your MD immediately  if symptoms less severe.  You Must read complete instructions/literature along with all the possible adverse reactions/side effects for all the Medicines you take and that have been prescribed to you. Take any new Medicines after you have completely understood and accpet all the possible adverse reactions/side effects.   Do not drive, operate heavy machinery, perform activities at heights, swimming or participation in water activities or provide baby sitting services if your were admitted for syncope or siezures until you have seen by Primary MD or a Neurologist and advised to do so again.  Do not drive when taking Pain medications.    Do not take more than prescribed Pain, Sleep and Anxiety Medications  Special Instructions: If you have smoked or chewed Tobacco  in the last 2 yrs please stop smoking, stop any regular Alcohol  and or any Recreational drug use.  Wear Seat belts while driving.   Please note  You were cared for by a hospitalist during your hospital stay. If you have any questions about your discharge medications or the care you received while you were in the hospital after you are discharged, you can call the unit and asked to speak with the hospitalist on call if the hospitalist that took care of you is not available. Once you are discharged, your primary care physician will handle any further medical issues. Please note that NO REFILLS for any discharge medications will be authorized once you are discharged, as it is imperative that you return to your primary care physician (or establish a relationship with a primary care physician if you do not have one) for your aftercare needs  so that they can reassess your need for medications and monitor your lab values.   Increase activity slowly    Complete by:  As directed       Discharge Medications   Allergies as of 06/05/2016   No Known Allergies     Medication List    STOP taking these medications   diclofenac sodium 1 % Gel Commonly known as:  VOLTAREN   ibuprofen 600 MG tablet Commonly known as:  ADVIL,MOTRIN  lisinopril 5 MG tablet Commonly known as:  PRINIVIL,ZESTRIL   predniSONE 10 MG tablet Commonly known as:  DELTASONE     TAKE these medications   carvedilol 6.25 MG tablet Commonly known as:  COREG Take 1 tablet (6.25 mg total) by mouth 2 (two) times daily.   clopidogrel 75 MG tablet Commonly known as:  PLAVIX Take 1 tablet (75 mg total) by mouth daily.   sildenafil 100 MG tablet Commonly known as:  VIAGRA Take 0.5-1 tablets (50-100 mg total) by mouth daily as needed for erectile dysfunction.   simvastatin 20 MG tablet Commonly known as:  ZOCOR Take 1 tablet (20 mg total) by mouth every evening.       Follow-up Information    Dessa PhiFunches, Josalyn, MD. Schedule an appointment as soon as possible for a visit in 1 week(s).   Specialty:  Family Medicine Contact information: [address] [place] [address] [phone]        Micki RileySethi, Pramod S, MD. Schedule an appointment as soon as possible for a visit in 1 month(s).   Specialties:  Neurology, Radiology Contact information: [address] [place] [address] [phone]           Major procedures and Radiology Reports - PLEASE review detailed and final reports thoroughly  -     TTE - Study Conclusions  - Left ventricle: The cavity size was normal. Wall thickness was   normal. Systolic function was mildly to moderately reduced. The   estimated ejection fraction was in the range of 40% to 45%.   Anterior and anteroseptal and apical hypokinesis. Doppler   parameters are consistent with abnormal left  ventricular   relaxation (grade 1 diastolic dysfunction). The E/e&' ratio is   between 8-15, suggesting indeterminate LV filling pressure. - Aortic valve: Sclerosis without stenosis. There was no   regurgitation. - Mitral valve: Mildly thickened leaflets . There was trivial   regurgitation. - Left atrium: The atrium was normal in size. - Inferior vena cava: The vessel was normal in size. The   respirophasic diameter changes were in the normal range (>= 50%),   consistent with normal central venous pressure.  Impressions:  - Compard to a prior study in 2016, the LVEF has improved to 40-45%   with persistent anterior WMA&'s.      Ct Head Wo Contrast  Result Date: 06/03/2016 CLINICAL DATA:  Right-sided facial droop and slurred speech EXAM: CT HEAD WITHOUT CONTRAST TECHNIQUE: Contiguous axial images were obtained from the base of the skull through the vertex without intravenous contrast. COMPARISON:  None. FINDINGS: Brain: Small lacunar infarct is noted in the basal ganglia on the left laterally. No findings to suggest acute hemorrhage, acute infarction or space-occupying mass lesion are noted. Vascular: No hyperdense vessel or unexpected calcification. Skull: Normal. Negative for fracture or focal lesion. Sinuses/Orbits: No acute finding. Other: None. 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 No acute abnormality is noted. Electronically Signed   By: Alcide CleverMark  Lukens M.D.   On: 06/03/2016 10:34   Mr Maxine GlennMra Neck W Wo Contrast  Result Date: 06/03/2016 CLINICAL DATA:  RIGHT-sided weakness with slurred speech. EXAM: MRI HEAD WITHOUT AND WITH CONTRAST AND MRA HEAD WITHOUT CONTRAST AND MRI NECK WITHOUT AND WITH CONTRAST TECHNIQUE: Multiplanar, multiecho pulse sequences of the brain and surrounding structures were obtained without and with intravenous contrast. Angiographic images of the head were obtained using MRA technique without and with contrast. Multiplanar, multiecho  pulse sequences  of the neck and surrounding structures were obtained without and with intravenous contrast. CONTRAST:  18mL MULTIHANCE GADOBENATE DIMEGLUMINE 529 MG/ML IV SOLN COMPARISON:  CT head earlier today. FINDINGS: MRI HEAD FINDINGS Brain: Punctate foci of restricted diffusion affect the LEFT posterior frontal cortex and subcortical white matter, consistent with acute infarction. No similar abnormalities on the RIGHT or involving the brainstem. No hemorrhage, mass lesion, hydrocephalus, or extra-axial fluid. Normal for Pittman cerebral volume. Minor white matter disease, nonspecific, likely chronic microvascular ischemic change. Post infusion, no abnormal enhancement of the brain or meninges. Vascular: Normal flow voids. Skull and upper cervical spine: Normal marrow signal. Sinuses/Orbits: Negative. Other: None. MRA HEAD FINDINGS The internal carotid arteries appear widely patent. The basilar artery is widely patent with LEFT vertebral dominant, although both contribute. No proximal stenosis of the anterior, middle, or posterior cerebral arteries. There is focal narrowing, estimated 50% stenosis, RIGHT P2 PCA segment. No cerebellar branch occlusion. No saccular aneurysm. MRA NECK FINDINGS There is a common origin to the innominate and LEFT common carotid artery, so-called bovine trunk. No proximal stenosis of the great vessels including the subclavian arteries. Carotid bifurcations are free of disease. No cervical ICA dissection or fibromuscular change. Both vertebrals are patent and contribute to the basilar, LEFT dominant. Due to technical difficulties with the vascular slab, a small portion of the proximal LEFT vertebral was excluded on the data set, but is likely normal. IMPRESSION: Small foci of acute LEFT posterior frontal cortical and subcortical LEFT centrum semiovale infarction, nonhemorrhagic, LEFT MCA territory. Mild small vessel disease. No abnormal postcontrast enhancement of brain or meninges.  Unremarkable MRA of the intracranial and extracranial circulation. No proximal vascular stenosis, significant atheromatous change, or dissection is observed. Electronically Signed   By: Elsie Stain M.D.   On: 06/03/2016 15:37   Mr Laqueta Jean ZO Contrast  Result Date: 06/03/2016 CLINICAL DATA:  RIGHT-sided weakness with slurred speech. EXAM: MRI HEAD WITHOUT AND WITH CONTRAST AND MRA HEAD WITHOUT CONTRAST AND MRI NECK WITHOUT AND WITH CONTRAST TECHNIQUE: Multiplanar, multiecho pulse sequences of the brain and surrounding structures were obtained without and with intravenous contrast. Angiographic images of the head were obtained using MRA technique without and with contrast. Multiplanar, multiecho pulse sequences of the neck and surrounding structures were obtained without and with intravenous contrast. CONTRAST:  18mL MULTIHANCE GADOBENATE DIMEGLUMINE 529 MG/ML IV SOLN COMPARISON:  CT head earlier today. FINDINGS: MRI HEAD FINDINGS Brain: Punctate foci of restricted diffusion affect the LEFT posterior frontal cortex and subcortical white matter, consistent with acute infarction. No similar abnormalities on the RIGHT or involving the brainstem. No hemorrhage, mass lesion, hydrocephalus, or extra-axial fluid. Normal for Pittman cerebral volume. Minor white matter disease, nonspecific, likely chronic microvascular ischemic change. Post infusion, no abnormal enhancement of the brain or meninges. Vascular: Normal flow voids. Skull and upper cervical spine: Normal marrow signal. Sinuses/Orbits: Negative. Other: None. MRA HEAD FINDINGS The internal carotid arteries appear widely patent. The basilar artery is widely patent with LEFT vertebral dominant, although both contribute. No proximal stenosis of the anterior, middle, or posterior cerebral arteries. There is focal narrowing, estimated 50% stenosis, RIGHT P2 PCA segment. No cerebellar branch occlusion. No saccular aneurysm. MRA NECK FINDINGS There is a common origin to  the innominate and LEFT common carotid artery, so-called bovine trunk. No proximal stenosis of the great vessels including the subclavian arteries. Carotid bifurcations are free of disease. No cervical ICA dissection or fibromuscular change. Both vertebrals are patent and contribute to the basilar,  LEFT dominant. Due to technical difficulties with the vascular slab, a small portion of the proximal LEFT vertebral was excluded on the data set, but is likely normal. IMPRESSION: Small foci of acute LEFT posterior frontal cortical and subcortical LEFT centrum semiovale infarction, nonhemorrhagic, LEFT MCA territory. Mild small vessel disease. No abnormal postcontrast enhancement of brain or meninges. Unremarkable MRA of the intracranial and extracranial circulation. No proximal vascular stenosis, significant atheromatous change, or dissection is observed. Electronically Signed   By: Elsie Stain M.D.   On: 06/03/2016 15:37   Mr Maxine Glenn Head/brain ZO Cm  Result Date: 06/03/2016 CLINICAL DATA:  RIGHT-sided weakness with slurred speech. EXAM: MRI HEAD WITHOUT AND WITH CONTRAST AND MRA HEAD WITHOUT CONTRAST AND MRI NECK WITHOUT AND WITH CONTRAST TECHNIQUE: Multiplanar, multiecho pulse sequences of the brain and surrounding structures were obtained without and with intravenous contrast. Angiographic images of the head were obtained using MRA technique without and with contrast. Multiplanar, multiecho pulse sequences of the neck and surrounding structures were obtained without and with intravenous contrast. CONTRAST:  18mL MULTIHANCE GADOBENATE DIMEGLUMINE 529 MG/ML IV SOLN COMPARISON:  CT head earlier today. FINDINGS: MRI HEAD FINDINGS Brain: Punctate foci of restricted diffusion affect the LEFT posterior frontal cortex and subcortical white matter, consistent with acute infarction. No similar abnormalities on the RIGHT or involving the brainstem. No hemorrhage, mass lesion, hydrocephalus, or extra-axial fluid. Normal for  Pittman cerebral volume. Minor white matter disease, nonspecific, likely chronic microvascular ischemic change. Post infusion, no abnormal enhancement of the brain or meninges. Vascular: Normal flow voids. Skull and upper cervical spine: Normal marrow signal. Sinuses/Orbits: Negative. Other: None. MRA HEAD FINDINGS The internal carotid arteries appear widely patent. The basilar artery is widely patent with LEFT vertebral dominant, although both contribute. No proximal stenosis of the anterior, middle, or posterior cerebral arteries. There is focal narrowing, estimated 50% stenosis, RIGHT P2 PCA segment. No cerebellar branch occlusion. No saccular aneurysm. MRA NECK FINDINGS There is a common origin to the innominate and LEFT common carotid artery, so-called bovine trunk. No proximal stenosis of the great vessels including the subclavian arteries. Carotid bifurcations are free of disease. No cervical ICA dissection or fibromuscular change. Both vertebrals are patent and contribute to the basilar, LEFT dominant. Due to technical difficulties with the vascular slab, a small portion of the proximal LEFT vertebral was excluded on the data set, but is likely normal. IMPRESSION: Small foci of acute LEFT posterior frontal cortical and subcortical LEFT centrum semiovale infarction, nonhemorrhagic, LEFT MCA territory. Mild small vessel disease. No abnormal postcontrast enhancement of brain or meninges. Unremarkable MRA of the intracranial and extracranial circulation. No proximal vascular stenosis, significant atheromatous change, or dissection is observed. Electronically Signed   By: Elsie Stain M.D.   On: 06/03/2016 15:37    Micro Results     No results found for this or any previous visit (from the past 240 hour(s)).  Today   Subjective    Kyle Pittman today has no headache,no chest abdominal pain,no new weakness tingling or numbness, feels much better    Objective   Blood pressure 103/70, pulse 75,  temperature 98.4 F (36.9 C), temperature source Oral, resp. rate 18, SpO2 98 %.   Intake/Output Summary (Last 24 hours) at 06/05/16 1343 Last data filed at 06/05/16 0107  Gross per 24 hour  Intake              340 ml  Output  1750 ml  Net            -1410 ml    Exam Awake Alert, Oriented x 3, No new F.N deficits, Normal affect Kyle Pittman.AT,PERRAL Supple Neck,No JVD, No cervical lymphadenopathy appriciated.  Symmetrical Chest wall movement, Good air movement bilaterally, CTAB RRR,No Gallops,Rubs or new Murmurs, No Parasternal Heave +ve B.Sounds, Abd Soft, Non tender, No organomegaly appriciated, No rebound -guarding or rigidity. No Cyanosis, Clubbing or edema, No new Rash or bruise   Data Review   CBC w Diff: Lab Results  Component Value Date   WBC 5.8 06/05/2016   HGB 15.4 06/05/2016   HCT 45.4 06/05/2016   PLT 236 06/05/2016   LYMPHOPCT 53 06/03/2016   MONOPCT 8 06/03/2016   EOSPCT 4 06/03/2016   BASOPCT 1 06/03/2016    CMP: Lab Results  Component Value Date   NA 138 06/05/2016   K 4.0 06/05/2016   CL 102 06/05/2016   CO2 28 06/05/2016   BUN 9 06/05/2016   CREATININE 0.87 06/05/2016   PROT 6.8 06/05/2016   ALBUMIN 3.7 06/05/2016   BILITOT 0.7 06/05/2016   ALKPHOS 54 06/05/2016   AST 21 06/05/2016   ALT 23 06/05/2016  .  Lab Results  Component Value Date   CHOL 112 06/04/2016   HDL 28 (L) 06/04/2016   LDLCALC 61 06/04/2016   TRIG 117 06/04/2016   CHOLHDL 4.0 06/04/2016    Lab Results  Component Value Date   HGBA1C 6.0 (H) 06/04/2016    Total Time in preparing paper work, data evaluation and todays exam - 35 minutes  Susa Raring M.D on 06/05/2016 at 1:43 PM  [facility]   Office  [phone]

## 2016-06-05 NOTE — Progress Notes (Signed)
Pt, d/c back to jail, no new concerns, d/c instructions provided with teach back, pt verbalize understanding. Pt transported out of facility by RN and escorted by [employer]

## 2016-06-05 NOTE — Care Management Note (Signed)
Case Management Note  Patient Details  Name: Kyle Pittman AgeJames XXXFuller MRN: [id number] Date of Birth: 28-Nov-1955  Subjective/Objective:                    Action/Plan: [facility] today. No further needs per CM.   Expected Discharge Date:  06/05/16               Expected Discharge Plan:  [facility] Facility  In-House Referral:     Discharge planning Services     Post Acute Care Choice:    Choice offered to:     DME Arranged:    DME Agency:     HH Arranged:    HH Agency:     Status of Service:  Completed, signed off  If discussed at [employer] Meetings, dates discussed:    Additional Comments:  Kermit BaloKelli F Shelva Hetzer, RN 06/05/2016, 2:05 PM

## 2016-06-05 NOTE — Discharge Instructions (Signed)
Follow with Primary MD Dessa PhiFunches, Josalyn, MD in 7 days   Get CBC, CMP, 2 view Chest X ray checked  by Primary MD or SNF MD in 5-7 days ( we routinely change or add medications that can affect your baseline labs and fluid status, therefore we recommend that you get the mentioned basic workup next visit with your PCP, your PCP may decide not to get them or add new tests based on their clinical decision)  Activity: As tolerated with Full fall precautions use walker/cane & assistance as needed  Disposition discharged back to [facility]  Diet: Heart Healthy   For Heart failure patients - Check your Weight same time everyday, if you gain over 2 pounds, or you develop in leg swelling, experience more shortness of breath or chest pain, call your Primary MD immediately. Follow Cardiac Low Salt Diet and 1.5 lit/day fluid restriction.  On your next visit with your primary care physician please Get Medicines reviewed and adjusted.  Please request your Prim.MD to go over all Hospital Tests and Procedure/Radiological results at the follow up, please get all Hospital records sent to your Prim MD by signing hospital release before you go home.  If you experience worsening of your admission symptoms, develop shortness of breath, life threatening emergency, suicidal or homicidal thoughts you must seek medical attention immediately by calling 911 or calling your MD immediately  if symptoms less severe.  You Must read complete instructions/literature along with all the possible adverse reactions/side effects for all the Medicines you take and that have been prescribed to you. Take any new Medicines after you have completely understood and accpet all the possible adverse reactions/side effects.   Do not drive, operate heavy machinery, perform activities at heights, swimming or participation in water activities or provide baby sitting services if your were admitted for syncope or siezures until you have seen by Primary MD  or a Neurologist and advised to do so again.  Do not drive when taking Pain medications.    Do not take more than prescribed Pain, Sleep and Anxiety Medications  Special Instructions: If you have smoked or chewed Tobacco  in the last 2 yrs please stop smoking, stop any regular Alcohol  and or any Recreational drug use.  Wear Seat belts while driving.   Please note  You were cared for by a hospitalist during your hospital stay. If you have any questions about your discharge medications or the care you received while you were in the hospital after you are discharged, you can call the unit and asked to speak with the hospitalist on call if the hospitalist that took care of you is not available. Once you are discharged, your primary care physician will handle any further medical issues. Please note that NO REFILLS for any discharge medications will be authorized once you are discharged, as it is imperative that you return to your primary care physician (or establish a relationship with a primary care physician if you do not have one) for your aftercare needs so that they can reassess your need for medications and monitor your lab values.

## 2019-01-01 IMAGING — MR MR MRA NECK WO/W CM
14 of 22 series · 22 of 48 positions shown · IV contrast (multihance)
Comparison: CT head earlier today.

CLINICAL DATA: RIGHT-sided weakness with slurred speech.

EXAM:
MRI HEAD WITHOUT AND WITH CONTRAST AND MRA HEAD WITHOUT CONTRAST AND
MRI NECK WITHOUT AND WITH CONTRAST
TECHNIQUE: Multiplanar, multiecho pulse sequences of the brain and surrounding
structures were obtained without and with intravenous contrast.
Angiographic images of the head were obtained using MRA technique
without and with contrast. Multiplanar, multiecho pulse sequences of
the neck and surrounding structures were obtained without and with
intravenous contrast.
CONTRAST:  18mL MULTIHANCE GADOBENATE DIMEGLUMINE 529 MG/ML IV SOLN

[Series 3: DWI · axial · 3.0mm · 1.09mm/px · z∈[-42,+97]mm · 2 of 96 slices shown (1 of 4)]
[im 1/96]
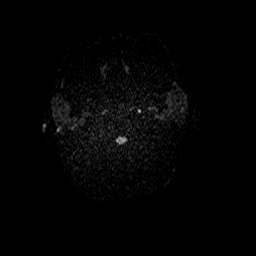
[im 96/96]
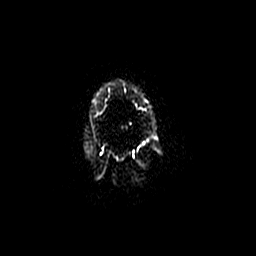

[Series 4: T1 · sagittal · 5.0mm · 0.47mm/px · 1 of 22 slices shown]
[im 1/22]
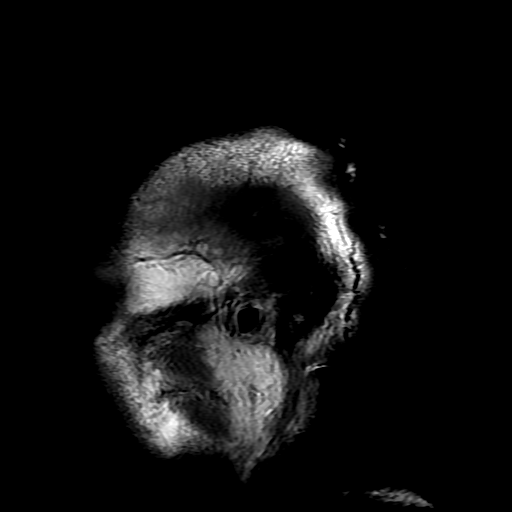

[Series 5: (id) mt fs · axial · 1.4mm · 0.43mm/px · z∈[-40,+54]mm · 4 of 136 slices shown]
[im 1/136]
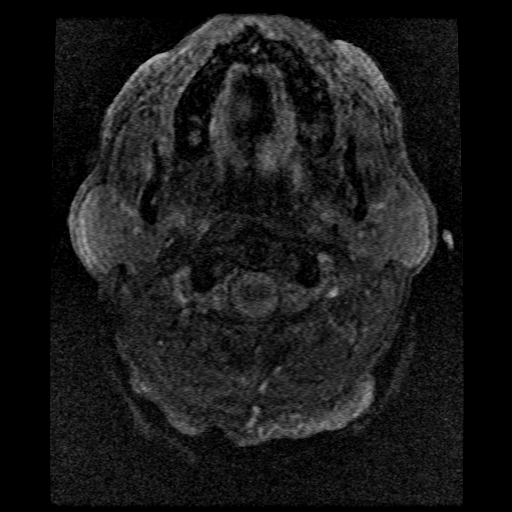
[im 46/136]
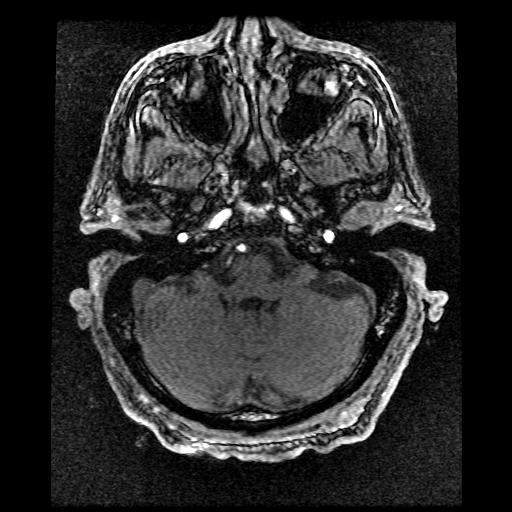
[im 91/136]
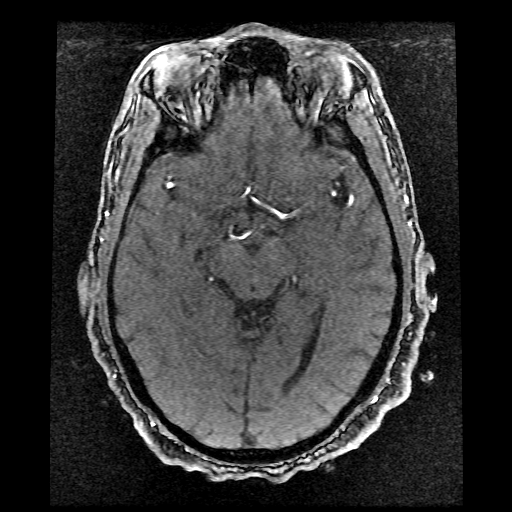
[im 136/136]
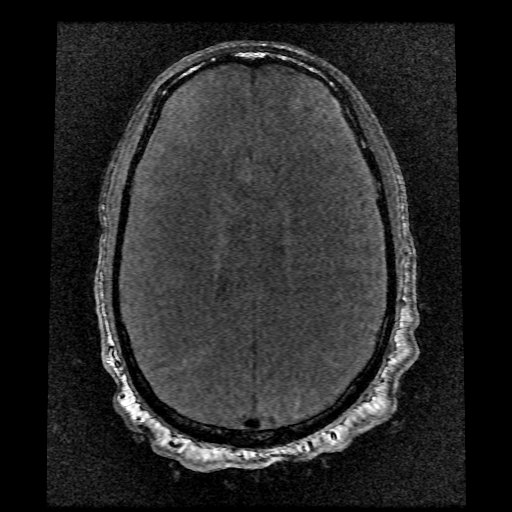

[Series 6: T2 · axial · 5.0mm · 0.47mm/px · 1 of 23 slices shown (1 of 2)]
[im 1/23]
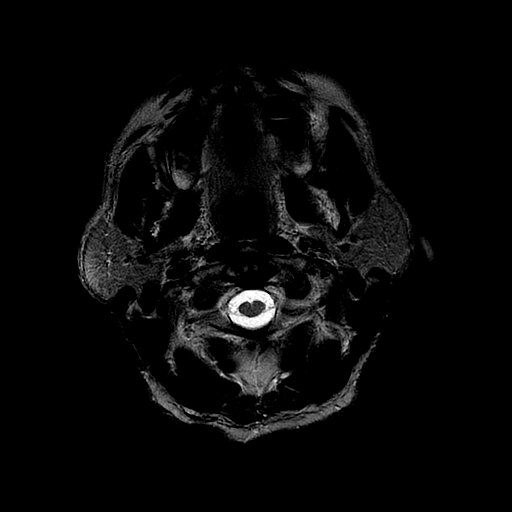

[Series 7: DWI · coronal · 5.0mm · 1.09mm/px · 1 of 60 slices shown (2 of 4)]
[im 1/60]
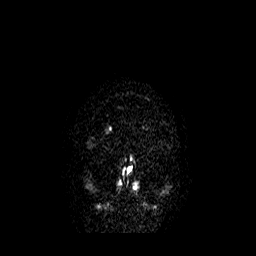

[Series 8: FLAIR · axial · 5.0mm · 0.47mm/px · 1 of 27 slices shown]
[im 1/27]
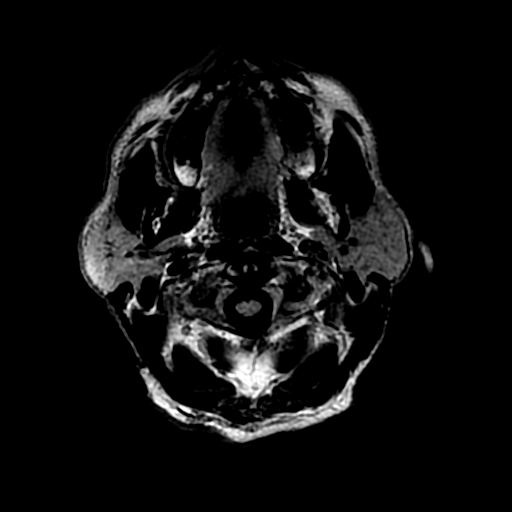

[Series 9: ax mpgr · axial · 5.0mm · 0.47mm/px · 1 of 23 slices shown]
[im 1/23]
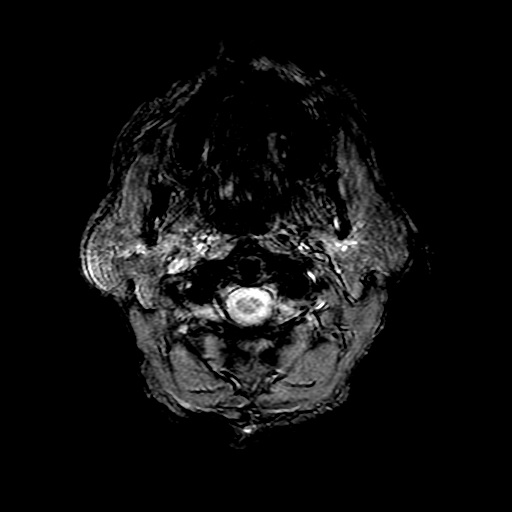

[Series 10: T2 · coronal · 5.0mm · 0.43mm/px · 1 of 28 slices shown (2 of 2)]
[im 1/28]
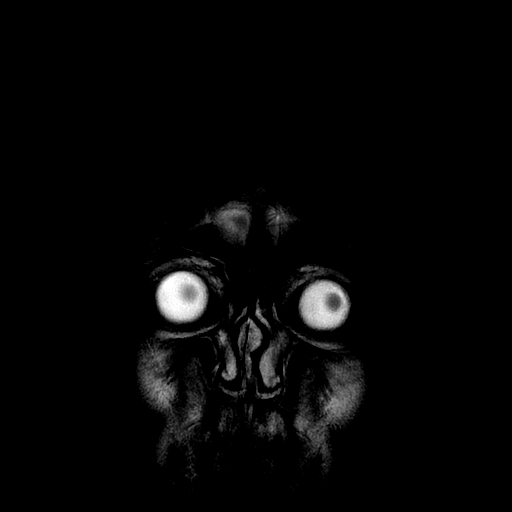

[Series 13: ax (id) · axial · 2.8mm · 0.47mm/px · z∈[-187,+1]mm · 4 of 140 slices shown]
[im 1/140]
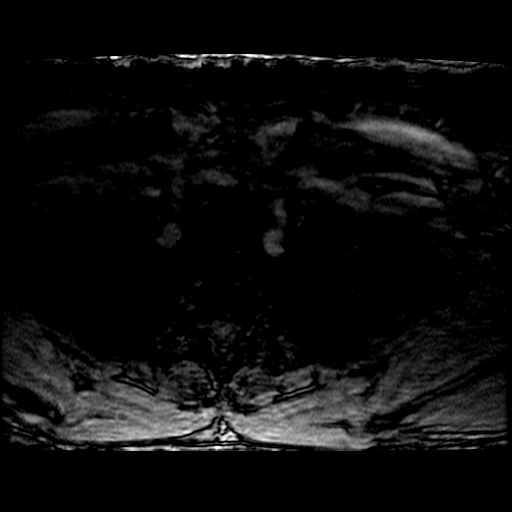
[im 47/140]
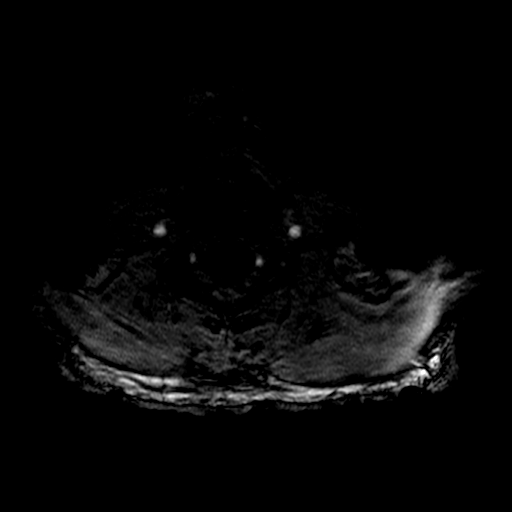
[im 93/140]
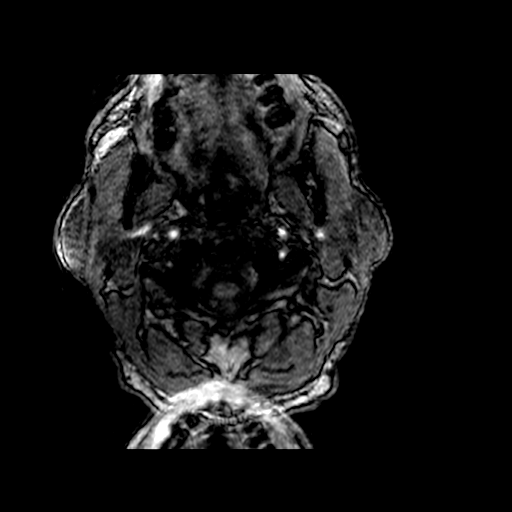
[im 140/140]
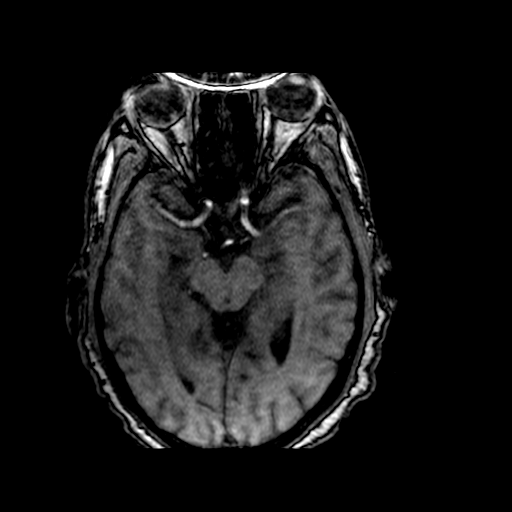

[Series 16: T1 post-contrast · coronal · 5.0mm · 0.43mm/px · 1 of 28 slices shown]
[im 1/28]
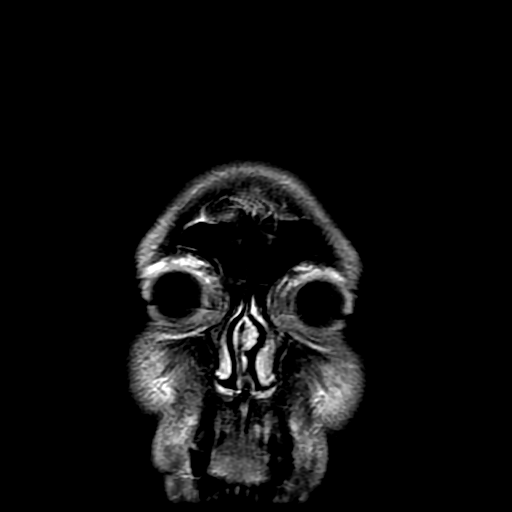

[Series 300: DWI · axial · 3.0mm · 1.09mm/px · z∈[-42,+97]mm · 2 of 48 slices shown (3 of 4)]
[im 1/48]
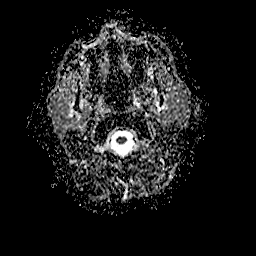
[im 48/48]
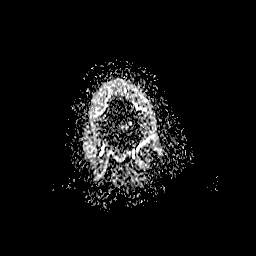

[Series 506: processed images · axial · 1.4mm · 0.43mm/px · 1 of 1 slices shown]
[im 1/1]
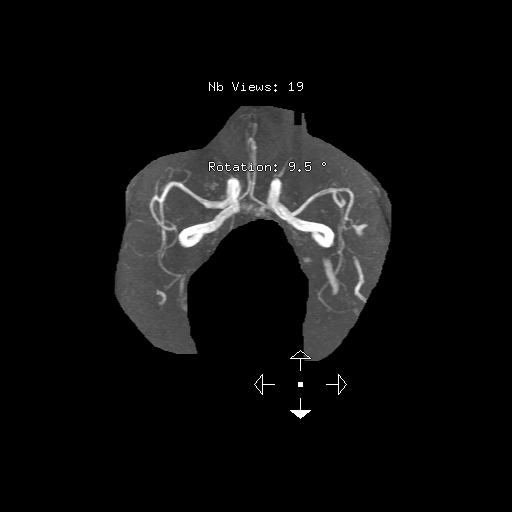

[Series 700: DWI · coronal · 5.0mm · 1.09mm/px · 1 of 30 slices shown (4 of 4)]
[im 1/30]
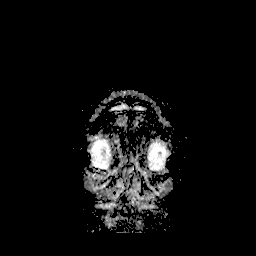

[Series 1300: col:ax (id) · axial · 2.8mm · 0.47mm/px · 1 of 1 slices shown]
[im 1/1]
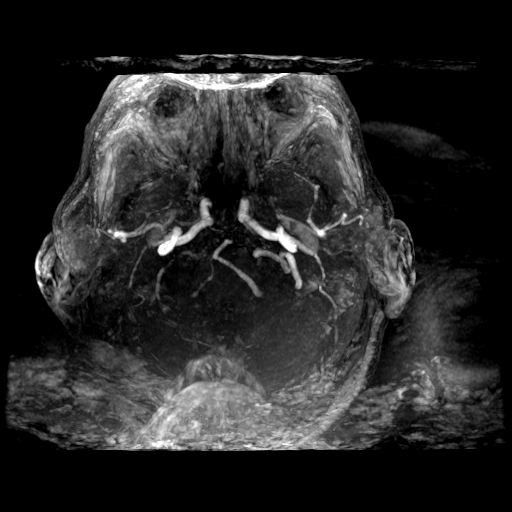

[22 of 48 positions shown; findings below may reference images not displayed]

FINDINGS: MRI HEAD FINDINGS

Brain: Punctate foci of restricted diffusion affect the LEFT
posterior frontal cortex and subcortical white matter, consistent
with acute infarction. No similar abnormalities on the RIGHT or
involving the brainstem.

No hemorrhage, mass lesion, hydrocephalus, or extra-axial fluid.

Normal for age cerebral volume. Minor white matter disease,
nonspecific, likely chronic microvascular ischemic change.

Post infusion, no abnormal enhancement of the brain or meninges.

Vascular: Normal flow voids.

Skull and upper cervical spine: Normal marrow signal.

Sinuses/Orbits: Negative.

Other: None.

MRA HEAD FINDINGS

The internal carotid arteries appear widely patent. The basilar
artery is widely patent with LEFT vertebral dominant, although both
contribute. No proximal stenosis of the anterior, middle, or
posterior cerebral arteries. There is focal narrowing, estimated 50%
stenosis, RIGHT P2 PCA segment. No cerebellar branch occlusion. No
saccular aneurysm.

MRA NECK FINDINGS

There is a common origin to the innominate and LEFT common carotid
artery, so-called bovine trunk. No proximal stenosis of the great
vessels including the subclavian arteries.

Carotid bifurcations are free of disease. No cervical ICA dissection
or fibromuscular change.

Both vertebrals are patent and contribute to the basilar, LEFT
dominant. Due to technical difficulties with the vascular slab, a
small portion of the proximal LEFT vertebral was excluded on the
data set, but is likely normal.
IMPRESSION: Small foci of acute LEFT posterior frontal cortical and subcortical
LEFT centrum semiovale infarction, nonhemorrhagic, LEFT MCA
territory.

Mild small vessel disease. No abnormal postcontrast enhancement of
brain or meninges.

Unremarkable MRA of the intracranial and extracranial circulation.
No proximal vascular stenosis, significant atheromatous change, or
dissection is observed.
